# Patient Record
Sex: Male | Born: 1975 | Race: White | Hispanic: No | Marital: Married | Smoking: Never smoker
Health system: Southern US, Community
[De-identification: ages and names within clinical notes are randomized; demographics above are authoritative.]

## PROBLEM LIST (undated history)

## (undated) DIAGNOSIS — F32A Depression, unspecified: Secondary | ICD-10-CM

## (undated) DIAGNOSIS — F419 Anxiety disorder, unspecified: Secondary | ICD-10-CM

## (undated) HISTORY — DX: Depression, unspecified: F32.A

## (undated) HISTORY — DX: Anxiety disorder, unspecified: F41.9

## (undated) HISTORY — PX: TONSILLECTOMY: SUR1361

---

## 2016-02-27 DIAGNOSIS — F411 Generalized anxiety disorder: Secondary | ICD-10-CM | POA: Insufficient documentation

## 2016-02-27 DIAGNOSIS — F3340 Major depressive disorder, recurrent, in remission, unspecified: Secondary | ICD-10-CM | POA: Insufficient documentation

## 2020-11-27 ENCOUNTER — Encounter (HOSPITAL_BASED_OUTPATIENT_CLINIC_OR_DEPARTMENT_OTHER): Payer: Self-pay | Admitting: Family Medicine

## 2020-11-27 ENCOUNTER — Other Ambulatory Visit: Payer: Self-pay

## 2020-11-27 ENCOUNTER — Other Ambulatory Visit (HOSPITAL_BASED_OUTPATIENT_CLINIC_OR_DEPARTMENT_OTHER)
Admission: RE | Admit: 2020-11-27 | Discharge: 2020-11-27 | Disposition: A | Discharge status: Home / Self Care | Payer: 59 | Source: Ambulatory Visit | Attending: Family Medicine | Admitting: Family Medicine

## 2020-11-27 ENCOUNTER — Ambulatory Visit (INDEPENDENT_AMBULATORY_CARE_PROVIDER_SITE_OTHER): Payer: 59 | Admitting: Family Medicine

## 2020-11-27 VITALS — BP 112/88 | HR 95 | Ht 65.0 in | Wt 217.6 lb

## 2020-11-27 DIAGNOSIS — R400 Somnolence: Secondary | ICD-10-CM | POA: Diagnosis not present

## 2020-11-27 DIAGNOSIS — Z6836 Body mass index (BMI) 36.0-36.9, adult: Secondary | ICD-10-CM | POA: Diagnosis not present

## 2020-11-27 DIAGNOSIS — G4700 Insomnia, unspecified: Secondary | ICD-10-CM

## 2020-11-27 DIAGNOSIS — F3181 Bipolar II disorder: Secondary | ICD-10-CM

## 2020-11-27 DIAGNOSIS — F419 Anxiety disorder, unspecified: Secondary | ICD-10-CM | POA: Insufficient documentation

## 2020-11-27 DIAGNOSIS — Z7689 Persons encountering health services in other specified circumstances: Secondary | ICD-10-CM | POA: Diagnosis not present

## 2020-11-27 LAB — HEMOGLOBIN A1C
Hgb A1c MFr Bld: 5.6 % (ref 4.8–5.6)
Mean Plasma Glucose: 114.02 mg/dL

## 2020-11-27 LAB — COMPREHENSIVE METABOLIC PANEL
ALT: 97 U/L — ABNORMAL HIGH (ref 0–44)
AST: 54 U/L — ABNORMAL HIGH (ref 15–41)
Albumin: 4.9 g/dL (ref 3.5–5.0)
Alkaline Phosphatase: 66 U/L (ref 38–126)
Anion gap: 11 (ref 5–15)
BUN: 8 mg/dL (ref 6–20)
CO2: 27 mmol/L (ref 22–32)
Calcium: 10.7 mg/dL — ABNORMAL HIGH (ref 8.9–10.3)
Chloride: 99 mmol/L (ref 98–111)
Creatinine, Ser: 0.96 mg/dL (ref 0.61–1.24)
GFR, Estimated: >60 mL/min (ref >60)
Glucose, Bld: 80 mg/dL (ref 70–99)
Potassium: 4.2 mmol/L (ref 3.5–5.1)
Sodium: 137 mmol/L (ref 135–145)
Total Bilirubin: 0.4 mg/dL (ref 0.3–1.2)
Total Protein: 7.9 g/dL (ref 6.5–8.1)

## 2020-11-27 LAB — CBC WITH DIFFERENTIAL/PLATELET
Abs Immature Granulocytes: 0 10*3/uL (ref 0.00–0.07)
Basophils Absolute: 0 10*3/uL (ref 0.0–0.1)
Basophils Relative: 0 %
Eosinophils Absolute: 0.2 10*3/uL (ref 0.0–0.5)
Eosinophils Relative: 2 %
HCT: 51.7 % (ref 39.0–52.0)
Hemoglobin: 17.7 g/dL — ABNORMAL HIGH (ref 13.0–17.0)
Lymphocytes Relative: 37 %
Lymphs Abs: 4.3 10*3/uL — ABNORMAL HIGH (ref 0.7–4.0)
MCH: 30.4 pg (ref 26.0–34.0)
MCHC: 34.2 g/dL (ref 30.0–36.0)
MCV: 88.7 fL (ref 80.0–100.0)
Monocytes Absolute: 0.6 10*3/uL (ref 0.1–1.0)
Monocytes Relative: 5 %
Neutro Abs: 6.4 10*3/uL (ref 1.7–7.7)
Neutrophils Relative %: 56 %
Platelets: 420 10*3/uL — ABNORMAL HIGH (ref 150–400)
RBC: 5.83 MIL/uL — ABNORMAL HIGH (ref 4.22–5.81)
RDW: 13.6 % (ref 11.5–15.5)
WBC: 11.5 10*3/uL — ABNORMAL HIGH (ref 4.0–10.5)
nRBC: 0 % (ref 0.0–0.2)

## 2020-11-27 LAB — LIPID PANEL
Cholesterol: 392 mg/dL — ABNORMAL HIGH (ref 0–200)
HDL: 50 mg/dL (ref >40)
LDL Cholesterol: UNDETERMINED mg/dL (ref 0–99)
Total CHOL/HDL Ratio: 7.8 RATIO
Triglycerides: 502 mg/dL — ABNORMAL HIGH (ref <150)
VLDL: UNDETERMINED mg/dL (ref 0–40)

## 2020-11-27 LAB — LDL CHOLESTEROL, DIRECT: Direct LDL: 272.4 mg/dL — ABNORMAL HIGH (ref 0–99)

## 2020-11-27 NOTE — Patient Instructions (Signed)
  Medication Instructions:  Your physician recommends that you continue on your current medications as directed. Please refer to the Current Medication list given to you today. --If you need a refill on any your medications before your next appointment, please call your pharmacy first. If no refills are authorized on file call the office.--  Lab Work: Your physician has recommended that you have lab work today: CBC, CMP, Lipid Profile, and A1C If you have labs (blood work) drawn today and your tests are completely normal, you will receive your results only by: Marland Kitchen MyChart Message (if you have MyChart) OR . A phone call from our staff. Please ensure you check your voicemail in the event that you authorized detailed messages to be left on a delegated number. If you have any lab test that is abnormal or we need to change your treatment, we will call you to review the results.  Referrals/Procedures/Imaging: A referral has been placed for you to Dr. Bosie Clos with [facility]. Dr. Bosie Clos is a psychologist who specializes in cognitive and behavioral therapy, he does not prescribe medications. Someone from the scheduling department will be in contact with you in regards to coordinating your consultation. If you do not hear from any of the schedulers within 7-10 business days please give our office a call  A referral has been placed for you to Dietician and Nutrition Therapy for consultation. Someone from the scheduling department will be in contact with you in regards to coordinating your consultation. If you do not hear from any of the schedulers within 7-10 business days please give our office a call.   Follow-Up: Your next appointment:   Your physician recommends that you schedule a follow-up appointment in: 4 WEEKS with Dr. de Peru  Thanks for letting us be apart of your health journey!!  Primary Care and Sports Medicine   Dr. de Peru and [name], DNP, AGNP  We recommend  signing up for the patient portal called "MyChart".  Sign up information is provided on this After Visit Summary.  MyChart is used to connect with patients for Virtual Visits (Telemedicine).  Patients are able to view lab/test results, encounter notes, upcoming appointments, etc.  Non-urgent messages can be sent to your provider as well.   To learn more about what you can do with MyChart, please visit --  [email or web address].

## 2020-11-28 DIAGNOSIS — R400 Somnolence: Secondary | ICD-10-CM

## 2020-11-28 DIAGNOSIS — F3181 Bipolar II disorder: Secondary | ICD-10-CM | POA: Insufficient documentation

## 2020-11-28 DIAGNOSIS — G4700 Insomnia, unspecified: Secondary | ICD-10-CM | POA: Insufficient documentation

## 2020-11-28 HISTORY — DX: Somnolence: R40.0

## 2020-11-28 NOTE — Assessment & Plan Note (Signed)
Epworth Sleepiness Scale completed today with total score of 10 indicating an above average amount of daytime sleepiness May need to consider sleep study for evaluation of possible underlying sleep apnea Discuss further at next office visit

## 2020-11-28 NOTE — Assessment & Plan Note (Signed)
Continue management with psychiatry Continue with aripiprazole, citalopram, trazodone Recommend discussing with them regarding sleep issues and whether any medication changes are needed to address from a medical standpoint

## 2020-11-28 NOTE — Progress Notes (Signed)
New Patient Office Visit  Subjective:  Patient ID: Derek Erickson, male    DOB: 1975-09-01  Age: 45 y.o. MRN: [id number]  CC:  Chief Complaint  Patient presents with  . Establish Care  . Palpitations    Patient states that at night he can feel his heart pounding. He is concerned because his father has a hx of heart disease. Patient states he can feel his heart racing between 80-90 bpm  . Weight Gain    Patient is concerned with weight gain, he states he thinks it may be linked to his antidepressants. He states he eats small amounts and still gains weight    HPI Derek Erickson is a 45 year old male presenting to establish in clinic.  Some concerns today related to recent weight gain as well as heart palpitations.  Past medical history is significant for bipolar 2 disorder, insomnia.  Weight gain: Reports in 2012 when he finished his work in [employer] he weighed around 168 pounds.  He has gradually gained weight since that time.  Most notably he has noted increased weight gain over the last couple of years and feels it may be related to his current medications.  Present medications include aripiprazole, escitalopram, trazodone which are prescribed through psychiatry for management of bipolar disorder, sleep issues.  Has questions about and is interested in modalities to work towards weight loss.  Heart palpitations: Reports he is feels he is mostly at night, less so during the day.  Feels as though his "heart is hammering".  At one point he was using an apple watch and notices heart rate at that time would be about 80 to 100 bpm.  Patient has some increased concerns surrounding this given that his father had a history of heart disease with early heart attacks with his first being younger than the age of 32 -does indicate that his father was a heavy smoker.  Associated with his difficulty sleeping, he does not also have daytime somnolence.  Feels that he does not have much issue in regards to  sleep maintenance however.  Denies any chest pain, shortness of breath, lightheadedness or dizziness related to the palpitations or during the days.  Follows with psychiatry at the [facility] in regards to his bipolar disorder and trying to manage any sleep issues.  Sees him about every 3 months.  Currently prescribed aripiprazole, citalopram and trazodone.  Has had some psychology sessions through the [place], but seems these were more so directed at PTSD treatment.  Past Medical History:  Diagnosis Date  . Anxiety   . Depression     History reviewed. No pertinent surgical history.  Family History  Problem Relation Age of Onset  . Diabetes Mother   . Heart disease Father   . Dementia Maternal Grandmother   . Cancer Maternal Grandfather     Social History   Socioeconomic History  . Marital status: Married    Spouse name: Not on file  . Number of children: Not on file  . Years of education: Not on file  . Highest education level: Not on file  Occupational History  . Not on file  Tobacco Use  . Smoking status: Never Smoker  . Smokeless tobacco: Current User    Types: Chew  Vaping Use  . Vaping Use: Never used  Substance and Sexual Activity  . Alcohol use: Not Currently  . Drug use: Never  . Sexual activity: Yes    Birth control/protection: None  Other Topics Concern  .  Not on file  Social History Narrative  . Not on file   Social Determinants of Health   Financial Resource Strain: Not on file  Food Insecurity: Not on file  Transportation Needs: Not on file  Physical Activity: Not on file  Stress: Not on file  Social Connections: Not on file  Intimate Partner Violence: Not on file    Objective:   Today's Vitals: BP 112/88   Pulse 95   Ht 5\' 5"  (1.651 m)   Wt 217 lb 9.6 oz (98.7 kg)   SpO2 98%   BMI 36.21 kg/m   Physical Exam  Pleasant 45 year old male in no acute distress Cardiovascular exam with regular rate and rhythm, no murmurs appreciated Lungs clear to  auscultation bilaterally  Assessment & Plan:   Problem List Items Addressed This Visit      Other   Bipolar II disorder (HCC)    Continue management with psychiatry Continue with aripiprazole, citalopram, trazodone Recommend discussing with them regarding sleep issues and whether any medication changes are needed to address from a medical standpoint      Insomnia    Will have patient discuss further with psychiatry Will also refer to psychology for further evaluation, likely CBT to help address symptoms      Relevant Orders   Ambulatory referral to Psychology   Daytime somnolence    Epworth Sleepiness Scale completed today with total score of 10 indicating an above average amount of daytime sleepiness May need to consider sleep study for evaluation of possible underlying sleep apnea Discuss further at next office visit       Other Visit Diagnoses    Establishing care with new doctor, encounter for    -  Primary   Relevant Orders   CBC with Differential/Platelet (Completed)   Comprehensive metabolic panel (Completed)   Hemoglobin A1c (Completed)   Lipid panel (Completed)   Amb ref to Medical Nutrition Therapy-[facility]   Anxiety       Relevant Medications   citalopram (CELEXA) 40 MG tablet   traZODone (DESYREL) 100 MG tablet   Other Relevant Orders   CBC with Differential/Platelet (Completed)   Comprehensive metabolic panel (Completed)   Lipid panel (Completed)   BMI 36.0-36.9,adult       Relevant Orders   Hemoglobin A1c (Completed)   Amb ref to Medical Nutrition Therapy-[facility]      Outpatient Encounter Medications as of 11/27/2020  Medication Sig  . ARIPiprazole (ABILIFY) 15 MG tablet Take 1 tablet by mouth daily.  . citalopram (CELEXA) 40 MG tablet Take 1 tablet by mouth daily.  . traZODone (DESYREL) 100 MG tablet Take 100 mg by mouth at bedtime.   No facility-administered encounter medications on file as of 11/27/2020.   Spent 45 minutes on this patient encounter,  including preparation, chart review, face-to-face counseling with patient and coordination of care, and documentation of encounter  Follow-up: Return in about 4 weeks (around 12/25/2020) for Follow Up.   Dontaye Hur J De 12/27/2020, MD

## 2020-11-28 NOTE — Assessment & Plan Note (Signed)
Will have patient discuss further with psychiatry Will also refer to psychology for further evaluation, likely CBT to help address symptoms

## 2020-11-29 ENCOUNTER — Other Ambulatory Visit (HOSPITAL_BASED_OUTPATIENT_CLINIC_OR_DEPARTMENT_OTHER): Payer: Self-pay | Admitting: Family Medicine

## 2020-11-30 ENCOUNTER — Other Ambulatory Visit: Payer: Self-pay

## 2020-11-30 ENCOUNTER — Other Ambulatory Visit (HOSPITAL_BASED_OUTPATIENT_CLINIC_OR_DEPARTMENT_OTHER): Payer: Self-pay | Admitting: *Deleted

## 2020-11-30 ENCOUNTER — Other Ambulatory Visit (HOSPITAL_BASED_OUTPATIENT_CLINIC_OR_DEPARTMENT_OTHER): Payer: Self-pay

## 2020-11-30 ENCOUNTER — Other Ambulatory Visit (HOSPITAL_BASED_OUTPATIENT_CLINIC_OR_DEPARTMENT_OTHER)
Admission: RE | Admit: 2020-11-30 | Discharge: 2020-11-30 | Disposition: A | Discharge status: Home / Self Care | Payer: 59 | Source: Ambulatory Visit | Attending: Family Medicine | Admitting: Family Medicine

## 2020-11-30 DIAGNOSIS — R7401 Elevation of levels of liver transaminase levels: Secondary | ICD-10-CM

## 2020-11-30 LAB — IRON AND TIBC
Iron: 70 ug/dL (ref 45–182)
Saturation Ratios: 17 % — ABNORMAL LOW (ref 17.9–39.5)
TIBC: 414 ug/dL (ref 250–450)
UIBC: 344 ug/dL

## 2020-11-30 LAB — HEPATITIS B CORE ANTIBODY, IGM: Hep B C IgM: NONREACTIVE

## 2020-11-30 LAB — HEPATITIS B SURFACE ANTIGEN: Hepatitis B Surface Ag: NONREACTIVE

## 2020-11-30 LAB — HEPATITIS B SURFACE ANTIBODY,QUALITATIVE: Hep B S Ab: REACTIVE — AB

## 2020-11-30 LAB — HEPATITIS C ANTIBODY: HCV Ab: NONREACTIVE

## 2020-12-01 ENCOUNTER — Other Ambulatory Visit (HOSPITAL_BASED_OUTPATIENT_CLINIC_OR_DEPARTMENT_OTHER)
Admission: RE | Admit: 2020-12-01 | Discharge: 2020-12-01 | Disposition: A | Discharge status: Home / Self Care | Payer: 59 | Source: Ambulatory Visit | Attending: Family Medicine | Admitting: Family Medicine

## 2020-12-01 ENCOUNTER — Telehealth (HOSPITAL_BASED_OUTPATIENT_CLINIC_OR_DEPARTMENT_OTHER): Payer: Self-pay

## 2020-12-01 DIAGNOSIS — R7401 Elevation of levels of liver transaminase levels: Secondary | ICD-10-CM | POA: Insufficient documentation

## 2020-12-01 NOTE — Telephone Encounter (Signed)
Received a message from the lab that there was not enough sample to run the erythropoietin lab that Dr. Tommi Rumps Peru requested Will reach out to the patient to see if he can go by the lab and give more sample

## 2020-12-02 LAB — ERYTHROPOIETIN: Erythropoietin: 8 m[IU]/mL (ref 2.6–18.5)

## 2020-12-04 ENCOUNTER — Other Ambulatory Visit: Payer: Self-pay

## 2020-12-04 ENCOUNTER — Ambulatory Visit (HOSPITAL_BASED_OUTPATIENT_CLINIC_OR_DEPARTMENT_OTHER)
Admission: RE | Admit: 2020-12-04 | Discharge: 2020-12-04 | Disposition: A | Discharge status: Home / Self Care | Payer: 59 | Source: Ambulatory Visit | Attending: Family Medicine | Admitting: Family Medicine

## 2020-12-04 DIAGNOSIS — K8020 Calculus of gallbladder without cholecystitis without obstruction: Secondary | ICD-10-CM | POA: Diagnosis not present

## 2020-12-04 DIAGNOSIS — R7401 Elevation of levels of liver transaminase levels: Secondary | ICD-10-CM | POA: Insufficient documentation

## 2020-12-04 DIAGNOSIS — K760 Fatty (change of) liver, not elsewhere classified: Secondary | ICD-10-CM | POA: Diagnosis not present

## 2020-12-06 ENCOUNTER — Other Ambulatory Visit: Payer: Self-pay

## 2020-12-06 ENCOUNTER — Other Ambulatory Visit (HOSPITAL_BASED_OUTPATIENT_CLINIC_OR_DEPARTMENT_OTHER): Payer: Self-pay

## 2020-12-06 ENCOUNTER — Ambulatory Visit: Payer: 59 | Attending: Internal Medicine

## 2020-12-06 DIAGNOSIS — Z23 Encounter for immunization: Secondary | ICD-10-CM

## 2020-12-06 MED ORDER — PFIZER-BIONT COVID-19 VAC-TRIS 30 MCG/0.3ML IM SUSP
INTRAMUSCULAR | 0 refills | Status: DC
  Filled 2020-12-06: qty 0.3, 1d supply, fill #0

## 2020-12-06 NOTE — Progress Notes (Signed)
   Covid-19 Vaccination Clinic  Name:  Derek Erickson    MRN: [id number] DOB: [birth year]  12/06/2020  Mr. Derek Erickson was observed post Covid-19 immunization for 15 minutes without incident. He was provided with Vaccine Information Sheet and instruction to access the V-Safe system.   Mr. Derek Erickson was instructed to call 911 with any severe reactions post vaccine: Marland Kitchen Difficulty breathing  . Swelling of face and throat  . A fast heartbeat  . A bad rash all over body  . Dizziness and weakness   Immunizations Administered    Name Date Dose VIS Date Route   Moderna Covid-19 Booster Vaccine 12/06/2020  1:41 PM 0.25 mL 05/17/2020 Intramuscular   Manufacturer: Moderna   Lot: [id number]   NDC: 05697-948-01

## 2020-12-19 ENCOUNTER — Ambulatory Visit: Payer: 59 | Admitting: Registered"

## 2021-01-01 ENCOUNTER — Ambulatory Visit (HOSPITAL_BASED_OUTPATIENT_CLINIC_OR_DEPARTMENT_OTHER): Payer: 59 | Admitting: Family Medicine

## 2021-01-04 ENCOUNTER — Other Ambulatory Visit: Payer: Self-pay

## 2021-01-04 ENCOUNTER — Encounter (HOSPITAL_BASED_OUTPATIENT_CLINIC_OR_DEPARTMENT_OTHER): Payer: Self-pay | Admitting: Family Medicine

## 2021-01-04 ENCOUNTER — Ambulatory Visit (HOSPITAL_BASED_OUTPATIENT_CLINIC_OR_DEPARTMENT_OTHER): Payer: 59 | Admitting: Family Medicine

## 2021-01-04 DIAGNOSIS — D72820 Lymphocytosis (symptomatic): Secondary | ICD-10-CM | POA: Diagnosis not present

## 2021-01-04 DIAGNOSIS — D751 Secondary polycythemia: Secondary | ICD-10-CM

## 2021-01-04 DIAGNOSIS — E781 Pure hyperglyceridemia: Secondary | ICD-10-CM | POA: Diagnosis not present

## 2021-01-04 DIAGNOSIS — D72829 Elevated white blood cell count, unspecified: Secondary | ICD-10-CM | POA: Insufficient documentation

## 2021-01-04 DIAGNOSIS — R7401 Elevation of levels of liver transaminase levels: Secondary | ICD-10-CM | POA: Diagnosis not present

## 2021-01-04 DIAGNOSIS — E785 Hyperlipidemia, unspecified: Secondary | ICD-10-CM

## 2021-01-04 HISTORY — DX: Secondary polycythemia: D75.1

## 2021-01-04 HISTORY — DX: Elevated white blood cell count, unspecified: D72.829

## 2021-01-04 NOTE — Assessment & Plan Note (Addendum)
Noted on most recent labs, discussed options with patient including initiation of statin therapy, lifestyle modifications Patient wishes to hold off on statin initiation and proceed with lifestyle modifications with monitoring lipid panel in about 6 to 12 months Patient also has upcoming appointment with nutritionist, encouraged to continue with this and discuss lifestyle modifications further during that visit Recheck lipid panel in about 6 to 12 months

## 2021-01-04 NOTE — Assessment & Plan Note (Signed)
As with erythrocytosis above, will refer to hematology for further evaluation and management

## 2021-01-04 NOTE — Assessment & Plan Note (Signed)
Mild elevation on recent lab work Erythropoietin level checked and was within normal range which would be considered inappropriate given level of red blood cell count Will refer to hematology for further evaluation and management

## 2021-01-04 NOTE — Assessment & Plan Note (Signed)
Noted on recent labs, discussed treatment options as with dyslipidemia including statin therapy, lifestyle modifications Meeting with nutritionist Plan to recheck lipid panel in 6 to 12 months

## 2021-01-04 NOTE — Assessment & Plan Note (Signed)
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

## 2021-01-04 NOTE — Progress Notes (Signed)
**Note Erickson-Identified via Obfuscation**     Procedures performed today:    None.  Independent interpretation of notes and tests performed by another provider:   None.  Brief History, Exam, Impression, and Recommendations:    Derek Erickson is a 46 year old male presenting for follow-up.  He reports that he has been doing well since last visit.  No specific questions or concerns today.  BP 126/84   Pulse 85   Ht 5\' 5"  (1.651 m)   Wt 214 lb (97.1 kg)   SpO2 97%   BMI 35.61 kg/m   Dyslipidemia Noted on most recent labs, discussed options with patient including initiation of statin therapy, lifestyle modifications Patient wishes to hold off on statin initiation and proceed with lifestyle modifications with monitoring lipid panel in about 6 to 12 months Patient also has upcoming appointment with nutritionist, encouraged to continue with this and discuss lifestyle modifications further during that visit Recheck lipid panel in about 6 to 12 months  Erythrocytosis Mild elevation on recent lab work Erythropoietin level checked and was within normal range which would be considered inappropriate given level of red blood cell count Will refer to hematology for further evaluation and management  Leukocytosis As with erythrocytosis above, will refer to hematology for further evaluation and management  Hypertriglyceridemia Noted on recent labs, discussed treatment options as with dyslipidemia including statin therapy, lifestyle modifications Meeting with nutritionist Plan to recheck lipid panel in 6 to 12 months  Transaminitis Likely 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  Advised that ideally follow-up will occur after initial evaluation with hematology.   ___________________________________________ Derek Erickson , MD, ABFM, CAQSM Primary Care and Sports Medicine [facility]

## 2021-01-04 NOTE — Patient Instructions (Signed)
  Medication Instructions:  Your physician recommends that you continue on your current medications as directed. Please refer to the Current Medication list given to you today. --If you need a refill on any your medications before your next appointment, please call your pharmacy first. If no refills are authorized on file call the office.--  Referrals/Procedures/Imaging: A referral has been placed for you to Hematology for evaluation and treatment. Someone from the scheduling department will be in contact with you in regards to coordinating your consultation. If you do not hear from any of the schedulers within 7-10 business days please give our office a call.  Follow-Up: Your next appointment:   Your physician recommends that you schedule a follow-up appointment in: 3 MONTHS with Dr. de Peru  Thanks for letting us be apart of your health journey!!  Primary Care and Sports Medicine   Dr. de Peru and [name], DNP, AGNP  We recommend signing up for the patient portal called "MyChart".  Sign up information is provided on this After Visit Summary.  MyChart is used to connect with patients for Virtual Visits (Telemedicine).  Patients are able to view lab/test results, encounter notes, upcoming appointments, etc.  Non-urgent messages can be sent to your provider as well.   To learn more about what you can do with MyChart, please visit --  [email or web address].

## 2021-01-09 ENCOUNTER — Telehealth: Payer: Self-pay | Admitting: Oncology

## 2021-01-09 NOTE — Telephone Encounter (Signed)
Scheduled appt per 6/9 referral. Pt aware.  

## 2021-02-07 ENCOUNTER — Encounter: Payer: Self-pay | Admitting: Dietician

## 2021-02-07 ENCOUNTER — Inpatient Hospital Stay: Payer: 59 | Admitting: Oncology

## 2021-02-07 ENCOUNTER — Other Ambulatory Visit: Payer: Self-pay

## 2021-02-07 ENCOUNTER — Encounter: Payer: 59 | Attending: Family Medicine | Admitting: Dietician

## 2021-02-07 VITALS — Ht 66.0 in | Wt 213.9 lb

## 2021-02-07 DIAGNOSIS — E669 Obesity, unspecified: Secondary | ICD-10-CM | POA: Diagnosis not present

## 2021-02-07 NOTE — Progress Notes (Signed)
Medical Nutrition Therapy  Appointment Start time:  [phone]  Appointment End time:  1550  Primary concerns today: Fatty Liver  Referral diagnosis: E66.9 - Obesity, unspecified Preferred learning style: No preference indicated Learning readiness: Ready   NUTRITION ASSESSMENT   Anthropometrics  Ht: 5'6" Wt: 213.9 lbs Body mass index is 34.52 kg/m.   Clinical Medical Hx: Bipolar II Disorder, Dyslipidemia, Fatty Liver Disease Medications: Abilify, Celexa, Trazadone Labs: TC - 392 (Very High) LDL - 272.4 (Very High) TGL - 502 (Very High) AST - 54 (High) ALT - 97 (High) Notable Signs/Symptoms: Chronic fatigue  Lifestyle & Dietary Hx Pt wife Arline Asp is present during appointment. Pt reports having elevated white and red blood cells, and hemoglobin. Pt will be seeing an oncologist for blood tests. Pt reports dipping and vaping, is concerned about the health effects it has on them. Pt had an abdominal scan that revealed fatty liver disease, lab values reflect this diagnosis. Pt reports drinking ~40 oz of water daily. Pt states they drink way too much soda, 3-4 16 oz Mountain Dews.  Pt also states they love the taste of milk and will drink large amounts at once. 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 Has a fridge and microwave at work. Pt walks most of the day at work, gets very little physical activity otherwise. Pt reports usually only eating a small snack for lunch usually, and usually skips breakfast. Pt eats the majority of their calories for dinner and in the evening.  Estimated daily fluid intake: 100 oz Supplements: None Sleep: Not well Stress / self-care: 2/10 Current average weekly physical activity: ADLs  24-Hr Dietary Recall First Meal: 2 Hot dogs (no bun) Snack: none Second Meal: Handful of Cheez-Its Snack: none Third Meal: McDonald's cheeseburger, fries, large coke Snack: Small stack of Pringles Beverages: 3 bottles of water,   NUTRITION DIAGNOSIS   NI-5.8.3 Inappropriate intake of types of carbohydrates (specify): Sugar sweetened beverages As related to fatty liver.  As evidenced by Abdominal CT scan, severely elevated triglycerides, and pt admission of excessive consumption of soda and milk..   NUTRITION INTERVENTION  Nutrition education (E-1) on the following topics:  Educate pt on the difference between LDL and HDL cholesterol. Educate pt on the factors that can increase and decrease HDL cholesterol. Including exercise to increase HDL, and tobacco use to decrease HDL. Educate pt on factors that can elevate LDL cholesterol, including high dietary intake of saturated fats. Educate pt on identifying sources of saturated fats, and how to make alternative food choices to lower saturated fat intake. Educate pt on the role of soluble fiber in binding to cholesterol in the GI tract an eliminating it from the body. Educate pt on dietary sources of soluble fiber. Educate pt on the potential dietary causes of hypertriglyceridemia.Educate on the role of elevated LDL,total cholesterol, and triglycerides on cardiovascular health. Educate pt on the role of physical activity in lowering LDL and increasing HDL cholesterol. Educated patient on the development and progression of fatty liver. Educated patient on nutrition strategies that could improve fatty liver including reduction of SSBs, reduction of saturated fats, increasing unsaturated fats, 150 minutes a week of moderate intensity physical activity, and weight loss of 10% body weight.  Handouts Provided Include  Balanced Plate Saturated Fat Worksheet Fatty Liver Disease Guidelines.  Learning Style & Readiness for Change Teaching method utilized: Visual & Auditory  Demonstrated degree of understanding via: Teach Back  Barriers to learning/adherence to lifestyle change: None  Goals  Established by Pt Work towards eating three meals a day, about 5-6 hours apart! Begin to build your meals using the  proportions of the Balanced Plate. First, select your carb choice(s) for the meal,  Next, select your source of protein to pair with your carb choice(s). Finally, complete the remaining half of your meal with a variety of non-starchy vegetables. Work your moderate intensity physical activity up to 150 minutes each week. Try to keep your heart rate in your target heart rate range of 115 - 150 BPM Work on reducing your soda intake. Try to start by only having 3 sodas each day. Then move to 2 sodas a day in the next week or two until you Try incorporating "Zero" sodas in place of regular sodas during this process as well.  Consider taking a fish oil supplement. Make sure it is "Burpless" or "Burp Free"   MONITORING & EVALUATION Dietary intake, weekly physical activity, and SSB intake in 2 months.  Next Steps  Patient is to follow up with RD.

## 2021-02-07 NOTE — Patient Instructions (Addendum)
Work towards eating three meals a day, about 5-6 hours apart!  Begin to build your meals using the proportions of the Balanced Plate. First, select your carb choice(s) for the meal,  Next, select your source of protein to pair with your carb choice(s). Finally, complete the remaining half of your meal with a variety of non-starchy vegetables.  Work your moderate intensity physical activity up to 150 minutes each week. Try to keep your heart rate in your target heart rate range of 115 - 150 BPM  Work on reducing your soda intake. Try to start by only having 3 sodas each day. Then move to 2 sodas a day in the next week or two until you Try incorporating "Zero" sodas in place of regular sodas during this process as well.   Consider taking a fish oil supplement. Make sure it is "Burpless" or "Burp Free"

## 2021-02-26 ENCOUNTER — Other Ambulatory Visit: Payer: Self-pay | Admitting: *Deleted

## 2021-02-26 DIAGNOSIS — D751 Secondary polycythemia: Secondary | ICD-10-CM

## 2021-02-27 ENCOUNTER — Inpatient Hospital Stay: Payer: 59 | Attending: Oncology

## 2021-02-27 ENCOUNTER — Other Ambulatory Visit: Payer: Self-pay

## 2021-02-27 ENCOUNTER — Inpatient Hospital Stay: Payer: 59 | Admitting: Oncology

## 2021-02-27 DIAGNOSIS — D751 Secondary polycythemia: Secondary | ICD-10-CM | POA: Diagnosis not present

## 2021-02-27 LAB — SAVE SMEAR(SSMR), FOR PROVIDER SLIDE REVIEW

## 2021-02-27 LAB — CBC WITH DIFFERENTIAL (CANCER CENTER ONLY)
Abs Immature Granulocytes: 0.02 10*3/uL (ref 0.00–0.07)
Basophils Absolute: 0.1 10*3/uL (ref 0.0–0.1)
Basophils Relative: 1 %
Eosinophils Absolute: 0.2 10*3/uL (ref 0.0–0.5)
Eosinophils Relative: 2 %
HCT: 50.2 % (ref 39.0–52.0)
Hemoglobin: 16.7 g/dL (ref 13.0–17.0)
Immature Granulocytes: 0 %
Lymphocytes Relative: 34 %
Lymphs Abs: 3 10*3/uL (ref 0.7–4.0)
MCH: 30.5 pg (ref 26.0–34.0)
MCHC: 33.3 g/dL (ref 30.0–36.0)
MCV: 91.8 fL (ref 80.0–100.0)
Monocytes Absolute: 0.7 10*3/uL (ref 0.1–1.0)
Monocytes Relative: 8 %
Neutro Abs: 4.8 10*3/uL (ref 1.7–7.7)
Neutrophils Relative %: 55 %
Platelet Count: 350 10*3/uL (ref 150–400)
RBC: 5.47 MIL/uL (ref 4.22–5.81)
RDW: 13.7 % (ref 11.5–15.5)
WBC Count: 8.8 10*3/uL (ref 4.0–10.5)
nRBC: 0 % (ref 0.0–0.2)

## 2021-03-14 ENCOUNTER — Encounter (HOSPITAL_BASED_OUTPATIENT_CLINIC_OR_DEPARTMENT_OTHER): Payer: Self-pay | Admitting: Family Medicine

## 2021-04-04 ENCOUNTER — Emergency Department (HOSPITAL_BASED_OUTPATIENT_CLINIC_OR_DEPARTMENT_OTHER): Payer: 59 | Admitting: Radiology

## 2021-04-04 ENCOUNTER — Encounter (HOSPITAL_BASED_OUTPATIENT_CLINIC_OR_DEPARTMENT_OTHER): Payer: Self-pay

## 2021-04-04 ENCOUNTER — Emergency Department (HOSPITAL_BASED_OUTPATIENT_CLINIC_OR_DEPARTMENT_OTHER)
Admission: EM | Admit: 2021-04-04 | Discharge: 2021-04-04 | Disposition: A | Discharge status: Home / Self Care | Payer: 59 | Attending: Emergency Medicine | Admitting: Emergency Medicine

## 2021-04-04 ENCOUNTER — Other Ambulatory Visit: Payer: Self-pay

## 2021-04-04 DIAGNOSIS — R079 Chest pain, unspecified: Secondary | ICD-10-CM

## 2021-04-04 DIAGNOSIS — R072 Precordial pain: Secondary | ICD-10-CM | POA: Diagnosis not present

## 2021-04-04 DIAGNOSIS — R0789 Other chest pain: Secondary | ICD-10-CM | POA: Diagnosis not present

## 2021-04-04 DIAGNOSIS — Z79899 Other long term (current) drug therapy: Secondary | ICD-10-CM | POA: Insufficient documentation

## 2021-04-04 LAB — BASIC METABOLIC PANEL
Anion gap: 11 (ref 5–15)
BUN: 6 mg/dL (ref 6–20)
CO2: 25 mmol/L (ref 22–32)
Calcium: 9.3 mg/dL (ref 8.9–10.3)
Chloride: 103 mmol/L (ref 98–111)
Creatinine, Ser: 0.8 mg/dL (ref 0.61–1.24)
GFR, Estimated: >60 mL/min (ref >60)
Glucose, Bld: 92 mg/dL (ref 70–99)
Potassium: 3.6 mmol/L (ref 3.5–5.1)
Sodium: 139 mmol/L (ref 135–145)

## 2021-04-04 LAB — CBC
HCT: 45 % (ref 39.0–52.0)
Hemoglobin: 15.4 g/dL (ref 13.0–17.0)
MCH: 30.2 pg (ref 26.0–34.0)
MCHC: 34.2 g/dL (ref 30.0–36.0)
MCV: 88.2 fL (ref 80.0–100.0)
Platelets: 346 10*3/uL (ref 150–400)
RBC: 5.1 MIL/uL (ref 4.22–5.81)
RDW: 13.2 % (ref 11.5–15.5)
WBC: 9.8 10*3/uL (ref 4.0–10.5)
nRBC: 0 % (ref 0.0–0.2)

## 2021-04-04 LAB — TROPONIN I (HIGH SENSITIVITY)
Troponin I (High Sensitivity): 3 ng/L (ref <18)
Troponin I (High Sensitivity): 3 ng/L (ref <18)

## 2021-04-04 MED ORDER — KETOROLAC TROMETHAMINE 30 MG/ML IJ SOLN
30.0000 mg | Freq: Once | INTRAMUSCULAR | Status: AC
  Administered 2021-04-04: 30 mg via INTRAVENOUS
  Filled 2021-04-04: qty 1

## 2021-04-04 NOTE — ED Provider Notes (Signed)
[facility] EMERGENCY DEPT Provider Note   CSN: [id number] Arrival date & time: 04/04/21  0036     History Chief Complaint  Patient presents with   Chest Pain    Derek Erickson is a 45 y.o. male.  Patient is a 45 year old male with history of anxiety.  Patient presenting today for evaluation of chest discomfort.  This started at approximately 730 this evening while doing homework with his daughter.  He describes tightness to the center of his chest with no shortness of breath, nausea, or diaphoresis.  Patient denies any recent exertional symptoms.  Cardiac risk factors include vaping and family history with his father apparently having had an MI in his mid 42s.  The history is provided by the patient.  Chest Pain Pain location:  Substernal area Pain quality: tightness   Pain radiates to:  Does not radiate Pain severity:  Moderate Onset quality:  Sudden Duration:  6 hours Timing:  Constant Chronicity:  New Relieved by:  Nothing Worsened by:  Nothing     Past Medical History:  Diagnosis Date   Anxiety    Depression     Patient Active Problem List   Diagnosis Date Noted   Dyslipidemia 01/04/2021   Hypertriglyceridemia 01/04/2021   Transaminitis 01/04/2021   Erythrocytosis 01/04/2021   Leukocytosis 01/04/2021   Bipolar II disorder (HCC) 11/28/2020   Insomnia 11/28/2020   Daytime somnolence 11/28/2020    History reviewed. No pertinent surgical history.     Family History  Problem Relation Age of Onset   Diabetes Mother    Heart disease Father    Dementia Maternal Grandmother    Cancer Maternal Grandfather     Social History   Tobacco Use   Smoking status: Never   Smokeless tobacco: Current    Types: Chew  Vaping Use   Vaping Use: Never used  Substance Use Topics   Alcohol use: Not Currently   Drug use: Never    Home Medications Prior to Admission medications   Medication Sig Start Date End Date Taking? Authorizing Provider   ARIPiprazole (ABILIFY) 15 MG tablet Take 1 tablet by mouth daily. 11/23/20   [provider]  citalopram (CELEXA) 40 MG tablet Take 1 tablet by mouth daily. 11/16/20 11/17/21  [provider]  traZODone (DESYREL) 100 MG tablet Take 100 mg by mouth at bedtime. 11/23/20   [provider]    Allergies    Prazosin  Review of Systems   Review of Systems  Cardiovascular:  Positive for chest pain.  All other systems reviewed and are negative.  Physical Exam Updated Vital Signs BP 132/77   Pulse 78   Temp 98.7 F (37.1 C) (Oral)   Resp 17   Ht 5\' 6"  (1.676 m)   Wt 98 kg   SpO2 97%   BMI 34.86 kg/m   Physical Exam Vitals and nursing note reviewed.  Constitutional:      General: He is not in acute distress.    Appearance: He is well-developed. He is not diaphoretic.  HENT:     Head: Normocephalic and atraumatic.  Cardiovascular:     Rate and Rhythm: Normal rate and regular rhythm.     Heart sounds: No murmur heard.   No friction rub.  Pulmonary:     Effort: Pulmonary effort is normal. No respiratory distress.     Breath sounds: Normal breath sounds. No wheezing or rales.  Abdominal:     General: Bowel sounds are normal. There is no distension.  Palpations: Abdomen is soft.     Tenderness: There is no abdominal tenderness.  Musculoskeletal:        General: Normal range of motion.     Cervical back: Normal range of motion and neck supple.     Right lower leg: No tenderness. No edema.     Left lower leg: No tenderness. No edema.  Skin:    General: Skin is warm and dry.  Neurological:     Mental Status: He is alert and oriented to person, place, and time.     Coordination: Coordination normal.    ED Results / Procedures / Treatments   Labs (all labs ordered are listed, but only abnormal results are displayed) Labs Reviewed  BASIC METABOLIC PANEL  CBC  TROPONIN I (HIGH SENSITIVITY)    EKG EKG Interpretation  Date/Time:  Wednesday  April 04 2021 00:50:19 EDT Ventricular Rate:  71 PR Interval:  128 QRS Duration: 94 QT Interval:  386 QTC Calculation: 419 R Axis:   76 Text Interpretation: Normal sinus rhythm Normal ECG Confirmed by Geoffery Lyons ([id number]) on 04/04/2021 12:53:42 AM  Radiology DG Chest 2 View  Result Date: 04/04/2021 CLINICAL DATA:  Chest pain EXAM: CHEST - 2 VIEW COMPARISON:  None. FINDINGS: The heart size and mediastinal contours are within normal limits. Both lungs are clear. The visualized skeletal structures are unremarkable. IMPRESSION: No active cardiopulmonary disease. Electronically Signed   By: Charlett Nose M.D.   On: 04/04/2021 01:12    Procedures Procedures   Medications Ordered in ED Medications  ketorolac (TORADOL) 30 MG/ML injection 30 mg (has no administration in time range)    ED Course  I have reviewed the triage vital signs and the nursing notes.  Pertinent labs & imaging results that were available during my care of the patient were reviewed by me and considered in my medical decision making (see chart for details).    MDM Rules/Calculators/A&P  Patient is a 45 year old male with no significant past medical history presenting with complaints of chest discomfort, the details of which are described in the HPI.  Initial EKG unremarkable and troponin x2 is negative.  Patient feeling better after receiving Toradol.  Patient's symptoms seem noncardiac in nature.  He does describe putting a new spice on Ramen noodles which preceded his symptoms.  Patient sitting up appropriate for discharge.  He does have a family history of heart disease so will place a referral to cardiology.  Patient to return as needed.  Final Clinical Impression(s) / ED Diagnoses Final diagnoses:  None    Rx / DC Orders ED Discharge Orders     None        Geoffery Lyons, MD 04/04/21 [phone]

## 2021-04-04 NOTE — ED Triage Notes (Signed)
Patient here POV from Home with CP.  Patient states his Pain is Mid-Chest and radiates slightly to the Right and Left Chest. Pain began approximately 1930 and has lessened in intensity but is still present.   Ambulatory. NAD Noted. GCS 15. A&Ox4. Mild to Moderate SOB with CP. No Nausea. No Vomiting.

## 2021-04-04 NOTE — Discharge Instructions (Addendum)
Follow-up with cardiology in the next few days.  Their contact information has been provided in this discharge summary for you to call and make these arrangements.  Return to the emergency department in the meantime if you develop worsening pain, difficulty breathing, or other new and concerning symptoms.

## 2021-04-09 ENCOUNTER — Ambulatory Visit (HOSPITAL_BASED_OUTPATIENT_CLINIC_OR_DEPARTMENT_OTHER): Payer: 59 | Admitting: Family Medicine

## 2021-04-10 ENCOUNTER — Ambulatory Visit: Payer: 59 | Admitting: Dietician

## 2021-04-12 ENCOUNTER — Ambulatory Visit (HOSPITAL_BASED_OUTPATIENT_CLINIC_OR_DEPARTMENT_OTHER): Payer: 59 | Admitting: Family Medicine

## 2021-04-17 ENCOUNTER — Other Ambulatory Visit (HOSPITAL_BASED_OUTPATIENT_CLINIC_OR_DEPARTMENT_OTHER): Payer: Self-pay

## 2021-04-17 MED ORDER — INFLUENZA VAC SPLIT QUAD 0.5 ML IM SUSY
PREFILLED_SYRINGE | INTRAMUSCULAR | 0 refills | Status: DC
  Filled 2021-04-17: qty 0.5, 1d supply, fill #0

## 2021-04-18 ENCOUNTER — Other Ambulatory Visit (HOSPITAL_BASED_OUTPATIENT_CLINIC_OR_DEPARTMENT_OTHER): Payer: Self-pay

## 2021-04-26 ENCOUNTER — Ambulatory Visit (HOSPITAL_BASED_OUTPATIENT_CLINIC_OR_DEPARTMENT_OTHER): Payer: 59 | Admitting: Family Medicine

## 2021-05-11 ENCOUNTER — Other Ambulatory Visit (HOSPITAL_BASED_OUTPATIENT_CLINIC_OR_DEPARTMENT_OTHER): Payer: Self-pay | Admitting: Nurse Practitioner

## 2021-05-11 ENCOUNTER — Other Ambulatory Visit (HOSPITAL_BASED_OUTPATIENT_CLINIC_OR_DEPARTMENT_OTHER): Payer: Self-pay

## 2021-05-11 DIAGNOSIS — J069 Acute upper respiratory infection, unspecified: Secondary | ICD-10-CM

## 2021-05-11 MED ORDER — HYDROCODONE BIT-HOMATROP MBR 5-1.5 MG/5ML PO SOLN
5.0000 mL | Freq: Three times a day (TID) | ORAL | 0 refills | Status: DC | PRN
  Filled 2021-05-11: qty 120, 8d supply, fill #0

## 2021-05-11 MED ORDER — AMOXICILLIN-POT CLAVULANATE 875-125 MG PO TABS
1.0000 | ORAL_TABLET | Freq: Two times a day (BID) | ORAL | 0 refills | Status: DC
  Filled 2021-05-11: qty 10, 5d supply, fill #0

## 2021-06-06 ENCOUNTER — Other Ambulatory Visit (HOSPITAL_BASED_OUTPATIENT_CLINIC_OR_DEPARTMENT_OTHER): Payer: Self-pay

## 2021-06-06 ENCOUNTER — Encounter (HOSPITAL_BASED_OUTPATIENT_CLINIC_OR_DEPARTMENT_OTHER): Payer: Self-pay | Admitting: Nurse Practitioner

## 2021-06-06 ENCOUNTER — Other Ambulatory Visit: Payer: Self-pay

## 2021-06-06 ENCOUNTER — Ambulatory Visit (HOSPITAL_BASED_OUTPATIENT_CLINIC_OR_DEPARTMENT_OTHER): Payer: 59 | Admitting: Nurse Practitioner

## 2021-06-06 VITALS — BP 120/76 | HR 83 | Wt 216.4 lb

## 2021-06-06 DIAGNOSIS — R638 Other symptoms and signs concerning food and fluid intake: Secondary | ICD-10-CM

## 2021-06-06 DIAGNOSIS — Z6835 Body mass index (BMI) 35.0-35.9, adult: Secondary | ICD-10-CM | POA: Insufficient documentation

## 2021-06-06 MED ORDER — PHENTERMINE HCL 37.5 MG PO CAPS
ORAL_CAPSULE | ORAL | 0 refills | Status: DC
  Filled 2021-06-06: qty 30, fill #0

## 2021-06-06 MED ORDER — PHENTERMINE HCL 37.5 MG PO CAPS
ORAL_CAPSULE | ORAL | 0 refills | Status: DC
  Filled 2021-06-06: qty 30, 30d supply, fill #0

## 2021-06-06 MED ORDER — PHENTERMINE HCL 37.5 MG PO CAPS
ORAL_CAPSULE | ORAL | 0 refills | Status: DC
  Filled 2021-06-06: qty 30, fill #0
  Filled 2021-07-12: qty 30, 30d supply, fill #0

## 2021-06-06 NOTE — Patient Instructions (Signed)
We will start phentermine today. Let me know if you have any concerns or questions about it or if you notice any worsening anxiety symptoms.

## 2021-06-06 NOTE — Assessment & Plan Note (Signed)
Weight gain in the setting of decreased physical activity and medications for BPII D/O.  Discussed medication options for patient and risks associated.  He is aware that phentermine may cause increased anxiety symptoms and will stop this medication if he notices any symptoms that are causing a change in mental status.  We also discussed GLP-1 medication options, which would be great for his lipids and CV health.  At this time a joint decision made to trial phentermine with diet and increased activity to help jump start weight loss. If he is unable to tolerate this medication or reaches the end of the medication use and still needs additional weight loss assistance, we will consider GLP-1.  F/U in 3 months for weight monitoring.

## 2021-06-06 NOTE — Progress Notes (Signed)
Established Patient Office Visit  Subjective:  Patient ID: Derek Erickson, male    DOB: October 07, 1975  Age: 45 y.o. MRN: [id number]  CC: No chief complaint on file.   HPI Derek Erickson presents for fatigue and weight concerns. He reports that he has recently gained weight and is having a very difficult time getting the extra weight off. He does have a history of high cholesterol and elevated liver enzymes, which likely indicate fatty liver. A1c was normal.  He has not tried anything in the past with the exception of OTC products which did not work.  He has a friend using phentermine with excellent results and wanted to know if this option would be good for him.    ROS Review of Systems All review of systems negative except what is listed in the HPI    Objective:    Physical Exam Vitals and nursing note reviewed.  Constitutional:      General: He is not in acute distress.    Appearance: Normal appearance. He is obese.  HENT:     Head: Normocephalic.  Eyes:     Extraocular Movements: Extraocular movements intact.     Conjunctiva/sclera: Conjunctivae normal.     Pupils: Pupils are equal, round, and reactive to light.  Neck:     Vascular: No carotid bruit.  Cardiovascular:     Rate and Rhythm: Normal rate and regular rhythm.     Pulses: Normal pulses.     Heart sounds: Normal heart sounds. No murmur heard. Pulmonary:     Effort: Pulmonary effort is normal. No respiratory distress.     Breath sounds: Normal breath sounds.  Abdominal:     General: Bowel sounds are normal.     Palpations: Abdomen is soft.  Musculoskeletal:        General: Normal range of motion.     Cervical back: Normal range of motion. No tenderness.     Right lower leg: No edema.     Left lower leg: No edema.  Lymphadenopathy:     Cervical: No cervical adenopathy.  Skin:    General: Skin is warm and dry.     Capillary Refill: Capillary refill takes less than 2 seconds.  Neurological:     General: No  focal deficit present.     Mental Status: He is alert and oriented to person, place, and time.  Psychiatric:        Mood and Affect: Mood normal.        Behavior: Behavior normal.        Thought Content: Thought content normal.        Judgment: Judgment normal.    There were no vitals taken for this visit. Wt Readings from Last 3 Encounters:  04/04/21 216 lb (98 kg)  02/07/21 213 lb 14.4 oz (97 kg)  01/04/21 214 lb (97.1 kg)     Health Maintenance Due  Topic Date Due   COVID-19 Vaccine (1) 10/12/1977   Pneumococcal Vaccine 67-38 Years old (1 - PCV) Never done   HIV Screening  Never done    There are no preventive care reminders to display for this patient.  No results found for: TSH Lab Results  Component Value Date   WBC 9.8 04/04/2021   HGB 15.4 04/04/2021   HCT 45.0 04/04/2021   MCV 88.2 04/04/2021   PLT 346 04/04/2021   Lab Results  Component Value Date   NA 139 04/04/2021   K 3.6 04/04/2021   CO2 25  04/04/2021   GLUCOSE 92 04/04/2021   BUN 6 04/04/2021   CREATININE 0.80 04/04/2021   BILITOT 0.4 11/27/2020   ALKPHOS 66 11/27/2020   AST 54 (H) 11/27/2020   ALT 97 (H) 11/27/2020   PROT 7.9 11/27/2020   ALBUMIN 4.9 11/27/2020   CALCIUM 9.3 04/04/2021   ANIONGAP 11 04/04/2021   Lab Results  Component Value Date   CHOL 392 (H) 11/27/2020   Lab Results  Component Value Date   HDL 50 11/27/2020   Lab Results  Component Value Date   LDLCALC UNABLE TO CALCULATE IF TRIGLYCERIDE OVER 400 mg/dL 08/67/6195   Lab Results  Component Value Date   TRIG 502 (H) 11/27/2020   Lab Results  Component Value Date   CHOLHDL 7.8 11/27/2020   Lab Results  Component Value Date   HGBA1C 5.6 11/27/2020      Assessment & Plan:   Problem List Items Addressed This Visit     Increased body mass index (BMI) - Primary    Weight gain in the setting of decreased physical activity and medications for BPII D/O.  Discussed medication options for patient and risks  associated.  He is aware that phentermine may cause increased anxiety symptoms and will stop this medication if he notices any symptoms that are causing a change in mental status.  We also discussed GLP-1 medication options, which would be great for his lipids and CV health.  At this time a joint decision made to trial phentermine with diet and increased activity to help jump start weight loss. If he is unable to tolerate this medication or reaches the end of the medication use and still needs additional weight loss assistance, we will consider GLP-1.  F/U in 3 months for weight monitoring.       Relevant Medications   phentermine 37.5 MG capsule (Start on 08/01/2021)   phentermine 37.5 MG capsule (Start on 07/04/2021)   phentermine 37.5 MG capsule    Meds ordered this encounter  Medications   phentermine 37.5 MG capsule    Sig: One capsule by mouth qAM    Dispense:  30 capsule    Refill:  0   phentermine 37.5 MG capsule    Sig: One capsule by mouth qAM    Dispense:  30 capsule    Refill:  0   phentermine 37.5 MG capsule    Sig: One capsule by mouth qAM    Dispense:  30 capsule    Refill:  0    Follow-up: Return in about 3 months (around 09/06/2021) for weight.    Tollie Eth, NP

## 2021-07-10 ENCOUNTER — Other Ambulatory Visit (HOSPITAL_BASED_OUTPATIENT_CLINIC_OR_DEPARTMENT_OTHER): Payer: Self-pay

## 2021-07-10 ENCOUNTER — Ambulatory Visit (INDEPENDENT_AMBULATORY_CARE_PROVIDER_SITE_OTHER): Payer: 59 | Admitting: Nurse Practitioner

## 2021-07-10 ENCOUNTER — Encounter (HOSPITAL_BASED_OUTPATIENT_CLINIC_OR_DEPARTMENT_OTHER): Payer: Self-pay | Admitting: Nurse Practitioner

## 2021-07-10 ENCOUNTER — Ambulatory Visit (HOSPITAL_BASED_OUTPATIENT_CLINIC_OR_DEPARTMENT_OTHER): Payer: 59 | Admitting: Family Medicine

## 2021-07-10 DIAGNOSIS — R21 Rash and other nonspecific skin eruption: Secondary | ICD-10-CM

## 2021-07-10 DIAGNOSIS — F4310 Post-traumatic stress disorder, unspecified: Secondary | ICD-10-CM | POA: Insufficient documentation

## 2021-07-10 DIAGNOSIS — L309 Dermatitis, unspecified: Secondary | ICD-10-CM | POA: Insufficient documentation

## 2021-07-10 DIAGNOSIS — F4312 Post-traumatic stress disorder, chronic: Secondary | ICD-10-CM | POA: Insufficient documentation

## 2021-07-10 HISTORY — DX: Rash and other nonspecific skin eruption: R21

## 2021-07-10 MED ORDER — CLOTRIMAZOLE-BETAMETHASONE 1-0.05 % EX CREA
1.0000 "application " | TOPICAL_CREAM | Freq: Two times a day (BID) | CUTANEOUS | 0 refills | Status: DC
  Filled 2021-07-10: qty 30, 15d supply, fill #0

## 2021-07-10 NOTE — Progress Notes (Signed)
Acute Office Visit  Subjective:    Patient ID: Derek Erickson, male    DOB: [birth year], 45 y.o.   MRN: [id number]  No chief complaint on file.   HPI Patient is in today for rash on his left ring finger where his wedding band sits. He started wearing a new rubber style ring recently and this weekend noticed redness on the dorsal surface of the ring finger with a blister. He thought it may have been an infected hair and squeezed the area, but only clear liquid drained from it.  He reports it feels like it is stinging, but not necessarily itching. It has not gotten any better or worse.  He has stopped wearing the ring on that finger for now.   Past Medical History:  Diagnosis Date   Anxiety    Depression     No past surgical history on file.  Family History  Problem Relation Age of Onset   Diabetes Mother    Heart disease Father    Dementia Maternal Grandmother    Cancer Maternal Grandfather     Social History   Socioeconomic History   Marital status: Married    Spouse name: Not on file   Number of children: Not on file   Years of education: Not on file   Highest education level: Not on file  Occupational History   Not on file  Tobacco Use   Smoking status: Never   Smokeless tobacco: Current    Types: Chew  Vaping Use   Vaping Use: Never used  Substance and Sexual Activity   Alcohol use: Not Currently   Drug use: Never   Sexual activity: Yes    Birth control/protection: None  Other Topics Concern   Not on file  Social History Narrative   Not on file   Social Determinants of Health   Financial Resource Strain: Not on file  Food Insecurity: Not on file  Transportation Needs: Not on file  Physical Activity: Not on file  Stress: Not on file  Social Connections: Not on file  Intimate Partner Violence: Not on file    Outpatient Medications Prior to Visit  Medication Sig Dispense Refill   amoxicillin-clavulanate (AUGMENTIN) 875-125 MG tablet Take 1 tablet by  mouth 2 (two) times daily. 10 tablet 0   ARIPiprazole (ABILIFY) 15 MG tablet Take 1 tablet by mouth daily.     citalopram (CELEXA) 40 MG tablet Take 1 tablet by mouth daily.     HYDROcodone bit-homatropine (HYCODAN) 5-1.5 MG/5ML syrup Take 5 mLs by mouth every 8 (eight) hours as needed for cough. 120 mL 0   influenza vac split quadrivalent PF (FLUARIX) 0.5 ML injection Inject into the muscle. 0.5 mL 0   [START ON 08/01/2021] phentermine 37.5 MG capsule One capsule by mouth qAM 30 capsule 0   phentermine 37.5 MG capsule One capsule by mouth qAM 30 capsule 0   phentermine 37.5 MG capsule One capsule by mouth qAM 30 capsule 0   traZODone (DESYREL) 100 MG tablet Take 100 mg by mouth at bedtime.     No facility-administered medications prior to visit.    Allergies  Allergen Reactions   Prazosin Other (See Comments)    LOC and Dizziness     Review of Systems All review of systems negative except what is listed in the HPI     Objective:    Physical Exam Vitals and nursing note reviewed.  Constitutional:      Appearance: Normal appearance.  Skin:  General: Skin is warm and dry.     Findings: Erythema and rash present.       Neurological:     Mental Status: He is alert.    There were no vitals taken for this visit. Wt Readings from Last 3 Encounters:  06/06/21 216 lb 6.4 oz (98.2 kg)  04/04/21 216 lb (98 kg)  02/07/21 213 lb 14.4 oz (97 kg)    Health Maintenance Due  Topic Date Due   COVID-19 Vaccine (1) 10/12/1977   Pneumococcal Vaccine 13-12 Years old (1 - PCV) Never done   HIV Screening  Never done    There are no preventive care reminders to display for this patient.   No results found for: TSH Lab Results  Component Value Date   WBC 9.8 04/04/2021   HGB 15.4 04/04/2021   HCT 45.0 04/04/2021   MCV 88.2 04/04/2021   PLT 346 04/04/2021   Lab Results  Component Value Date   NA 139 04/04/2021   K 3.6 04/04/2021   CO2 25 04/04/2021   GLUCOSE 92 04/04/2021    BUN 6 04/04/2021   CREATININE 0.80 04/04/2021   BILITOT 0.4 11/27/2020   ALKPHOS 66 11/27/2020   AST 54 (H) 11/27/2020   ALT 97 (H) 11/27/2020   PROT 7.9 11/27/2020   ALBUMIN 4.9 11/27/2020   CALCIUM 9.3 04/04/2021   ANIONGAP 11 04/04/2021   Lab Results  Component Value Date   CHOL 392 (H) 11/27/2020   Lab Results  Component Value Date   HDL 50 11/27/2020   Lab Results  Component Value Date   LDLCALC UNABLE TO CALCULATE IF TRIGLYCERIDE OVER 400 mg/dL 37/85/8850   Lab Results  Component Value Date   TRIG 502 (H) 11/27/2020   Lab Results  Component Value Date   CHOLHDL 7.8 11/27/2020   Lab Results  Component Value Date   HGBA1C 5.6 11/27/2020       Assessment & Plan:   Problem List Items Addressed This Visit   None    No orders of the defined types were placed in this encounter.    Tollie Eth, NP

## 2021-07-10 NOTE — Patient Instructions (Signed)
The rash appears to be caused by a fungal infection most likely related to the moisture that sits on the skin under the ring when you wash your hands. Avoid wearing the ring until the rash has completely resolved. Once you do start wearing it again, be sure to dry under the ring when you get your hands wet to prevent the infection from recurring.   If it does not get better, please let me know.

## 2021-07-10 NOTE — Assessment & Plan Note (Signed)
Suspect tinea infection to the finger related to increased moisture trapped with silicone ring.  No signs of drainage, streaking, or edema present.  Highly unlikely allergic response to silicone.  Recommend avoiding wearing the ring until the rash has completely resolved.  Will send lotrisone cream for BID use until resolved. It this takes longer than 10 days, he will let us know.  Recommend removing ring when hands are wet and drying area thoroughly before replacing the ring to avoid trapping water.

## 2021-07-11 ENCOUNTER — Other Ambulatory Visit (HOSPITAL_BASED_OUTPATIENT_CLINIC_OR_DEPARTMENT_OTHER): Payer: Self-pay

## 2021-07-12 ENCOUNTER — Other Ambulatory Visit (HOSPITAL_BASED_OUTPATIENT_CLINIC_OR_DEPARTMENT_OTHER): Payer: Self-pay

## 2021-07-20 ENCOUNTER — Other Ambulatory Visit (HOSPITAL_BASED_OUTPATIENT_CLINIC_OR_DEPARTMENT_OTHER): Payer: Self-pay

## 2021-08-16 ENCOUNTER — Other Ambulatory Visit (HOSPITAL_BASED_OUTPATIENT_CLINIC_OR_DEPARTMENT_OTHER): Payer: Self-pay

## 2021-08-16 ENCOUNTER — Encounter (HOSPITAL_BASED_OUTPATIENT_CLINIC_OR_DEPARTMENT_OTHER): Payer: Self-pay | Admitting: Nurse Practitioner

## 2021-08-16 ENCOUNTER — Other Ambulatory Visit: Payer: Self-pay

## 2021-08-16 ENCOUNTER — Ambulatory Visit (HOSPITAL_BASED_OUTPATIENT_CLINIC_OR_DEPARTMENT_OTHER): Payer: 59 | Admitting: Nurse Practitioner

## 2021-08-16 DIAGNOSIS — R300 Dysuria: Secondary | ICD-10-CM

## 2021-08-16 LAB — POCT URINALYSIS DIP (CLINITEK)
Bilirubin, UA: NEGATIVE
Glucose, UA: NEGATIVE mg/dL
Ketones, POC UA: NEGATIVE mg/dL
Leukocytes, UA: NEGATIVE
Nitrite, UA: NEGATIVE
POC PROTEIN,UA: NEGATIVE
Spec Grav, UA: 1.02 (ref 1.010–1.025)
Urobilinogen, UA: 0.2 E.U./dL
pH, UA: 7 (ref 5.0–8.0)

## 2021-08-16 MED ORDER — TAMSULOSIN HCL 0.4 MG PO CAPS
0.4000 mg | ORAL_CAPSULE | Freq: Every day | ORAL | 0 refills | Status: DC
  Filled 2021-08-16: qty 21, 21d supply, fill #0

## 2021-08-16 MED ORDER — NITROFURANTOIN MONOHYD MACRO 100 MG PO CAPS
100.0000 mg | ORAL_CAPSULE | Freq: Two times a day (BID) | ORAL | 0 refills | Status: DC
  Filled 2021-08-16: qty 10, 5d supply, fill #0

## 2021-08-16 NOTE — Progress Notes (Signed)
Acute Office Visit  Subjective:    Patient ID: Derek Erickson, male    DOB: 1976/05/19, 46 y.o.   MRN: [id number]  No chief complaint on file.   HPI Patient is in today for dysuria, increased urination, and urgency that has been going on for a few days. He also reports intermittent mild pain in the right lower back. He denies fever, chills, nausea, abdominal pain, difficulty starting stream, weak stream, or hematuria. He is sexually active in a mutually monogamous relationship. He does tell me that he drinks several mountain dew's a day and he previously drank approximately 4 monster drinks a day. He has cut back to one or two a month at this time. He endorses working to drink more water, but admits he does not drink as much as he should.   Past Medical History:  Diagnosis Date   Anxiety    Depression     No past surgical history on file.  Family History  Problem Relation Age of Onset   Diabetes Mother    Heart disease Father    Dementia Maternal Grandmother    Cancer Maternal Grandfather     Social History   Socioeconomic History   Marital status: Married    Spouse name: Not on file   Number of children: Not on file   Years of education: Not on file   Highest education level: Not on file  Occupational History   Not on file  Tobacco Use   Smoking status: Never   Smokeless tobacco: Current    Types: Chew  Vaping Use   Vaping Use: Never used  Substance and Sexual Activity   Alcohol use: Not Currently   Drug use: Never   Sexual activity: Yes    Birth control/protection: None  Other Topics Concern   Not on file  Social History Narrative   Not on file   Social Determinants of Health   Financial Resource Strain: Not on file  Food Insecurity: Not on file  Transportation Needs: Not on file  Physical Activity: Not on file  Stress: Not on file  Social Connections: Not on file  Intimate Partner Violence: Not on file    Outpatient Medications Prior to Visit   Medication Sig Dispense Refill   ARIPiprazole (ABILIFY) 15 MG tablet Take 1 tablet by mouth daily.     citalopram (CELEXA) 40 MG tablet Take 1 tablet by mouth daily.     phentermine 37.5 MG capsule One capsule by mouth qAM 30 capsule 0   phentermine 37.5 MG capsule One capsule by mouth qAM 30 capsule 0   phentermine 37.5 MG capsule One capsule by mouth qAM 30 capsule 0   traZODone (DESYREL) 100 MG tablet Take 100 mg by mouth at bedtime.     amoxicillin-clavulanate (AUGMENTIN) 875-125 MG tablet Take 1 tablet by mouth 2 (two) times daily. 10 tablet 0   clotrimazole-betamethasone (LOTRISONE) cream Apply 1 application topically 2 (two) times daily. 30 g 0   HYDROcodone bit-homatropine (HYCODAN) 5-1.5 MG/5ML syrup Take 5 mLs by mouth every 8 (eight) hours as needed for cough. 120 mL 0   influenza vac split quadrivalent PF (FLUARIX) 0.5 ML injection Inject into the muscle. 0.5 mL 0   No facility-administered medications prior to visit.    Allergies  Allergen Reactions   Prazosin Other (See Comments)    LOC and Dizziness     Review of Systems All review of systems negative except what is listed in the HPI  Objective:    Physical Exam Constitutional:      General: He is not in acute distress.    Appearance: Normal appearance. He is not ill-appearing.  HENT:     Head: Normocephalic.  Eyes:     Extraocular Movements: Extraocular movements intact.     Conjunctiva/sclera: Conjunctivae normal.     Pupils: Pupils are equal, round, and reactive to light.  Cardiovascular:     Rate and Rhythm: Normal rate and regular rhythm.     Pulses: Normal pulses.     Heart sounds: Normal heart sounds.  Pulmonary:     Effort: Pulmonary effort is normal.     Breath sounds: Normal breath sounds.  Abdominal:     General: Bowel sounds are normal. There is no distension.     Palpations: Abdomen is soft. There is no mass.     Tenderness: There is no abdominal tenderness. There is no right CVA  tenderness, left CVA tenderness, guarding or rebound.  Musculoskeletal:     Right lower leg: No edema.     Left lower leg: No edema.  Skin:    General: Skin is warm and dry.     Capillary Refill: Capillary refill takes less than 2 seconds.  Neurological:     General: No focal deficit present.     Mental Status: He is alert and oriented to person, place, and time.  Psychiatric:        Mood and Affect: Mood normal.        Behavior: Behavior normal.        Thought Content: Thought content normal.        Judgment: Judgment normal.    There were no vitals taken for this visit. Wt Readings from Last 3 Encounters:  06/06/21 216 lb 6.4 oz (98.2 kg)  04/04/21 216 lb (98 kg)  02/07/21 213 lb 14.4 oz (97 kg)    Health Maintenance Due  Topic Date Due   COVID-19 Vaccine (1) 10/12/1976    There are no preventive care reminders to display for this patient.   No results found for: TSH Lab Results  Component Value Date   WBC 9.8 04/04/2021   HGB 15.4 04/04/2021   HCT 45.0 04/04/2021   MCV 88.2 04/04/2021   PLT 346 04/04/2021   Lab Results  Component Value Date   NA 139 04/04/2021   K 3.6 04/04/2021   CO2 25 04/04/2021   GLUCOSE 92 04/04/2021   BUN 6 04/04/2021   CREATININE 0.80 04/04/2021   BILITOT 0.4 11/27/2020   ALKPHOS 66 11/27/2020   AST 54 (H) 11/27/2020   ALT 97 (H) 11/27/2020   PROT 7.9 11/27/2020   ALBUMIN 4.9 11/27/2020   CALCIUM 9.3 04/04/2021   ANIONGAP 11 04/04/2021   Lab Results  Component Value Date   CHOL 392 (H) 11/27/2020   Lab Results  Component Value Date   HDL 50 11/27/2020   Lab Results  Component Value Date   LDLCALC UNABLE TO CALCULATE IF TRIGLYCERIDE OVER 400 mg/dL 11/27/2020   Lab Results  Component Value Date   TRIG 502 (H) 11/27/2020   Lab Results  Component Value Date   CHOLHDL 7.8 11/27/2020   Lab Results  Component Value Date   HGBA1C 5.6 11/27/2020       Assessment & Plan:  Dysuria - Plan: nitrofurantoin,  macrocrystal-monohydrate, (MACROBID) 100 MG capsule, tamsulosin (FLOMAX) 0.4 MG CAPS capsule, POCT URINALYSIS DIP (CLINITEK), Urine Culture UA positive for trace-intact blood present today, negative for  nitrates and leukocytes. Given symptoms and history, suspect possible nephrolithiasis present without warning signs for obstruction. At this time will begin treatment with tamsulosin and macrobid for prophylactic treatment while we await culture. No concerns for STI or prostate issues at this time.  Recommend increase water intake and avoid soda and energy drinks as these can worsen condition.  Recommend monitor for new or worsening symptoms and notify immediately if any of these occur: fever, chills, abdominal pain, diarrhea, nausea, sweating, severe low back, side, or low pelvic pain, blood in urine, decreased urine.  He will follow-up if sx worsen or fail to improve.   Meds ordered this encounter  Medications   nitrofurantoin, macrocrystal-monohydrate, (MACROBID) 100 MG capsule    Sig: Take 1 capsule (100 mg total) by mouth 2 (two) times daily.    Dispense:  10 capsule    Refill:  0   tamsulosin (FLOMAX) 0.4 MG CAPS capsule    Sig: Take 1 capsule (0.4 mg total) by mouth daily.    Dispense:  21 capsule    Refill:  0     Orma Render, NP

## 2021-08-18 LAB — URINE CULTURE

## 2021-08-24 ENCOUNTER — Other Ambulatory Visit (HOSPITAL_BASED_OUTPATIENT_CLINIC_OR_DEPARTMENT_OTHER): Payer: Self-pay

## 2021-09-06 ENCOUNTER — Other Ambulatory Visit (HOSPITAL_BASED_OUTPATIENT_CLINIC_OR_DEPARTMENT_OTHER): Payer: Self-pay | Admitting: Nurse Practitioner

## 2021-09-06 ENCOUNTER — Other Ambulatory Visit: Payer: Self-pay

## 2021-09-06 ENCOUNTER — Ambulatory Visit (HOSPITAL_BASED_OUTPATIENT_CLINIC_OR_DEPARTMENT_OTHER)
Admission: RE | Admit: 2021-09-06 | Discharge: 2021-09-06 | Disposition: A | Discharge status: Home / Self Care | Payer: 59 | Source: Ambulatory Visit | Attending: Nurse Practitioner | Admitting: Nurse Practitioner

## 2021-09-06 DIAGNOSIS — R109 Unspecified abdominal pain: Secondary | ICD-10-CM | POA: Diagnosis not present

## 2021-09-06 DIAGNOSIS — R34 Anuria and oliguria: Secondary | ICD-10-CM | POA: Insufficient documentation

## 2021-09-07 ENCOUNTER — Ambulatory Visit: Payer: 59 | Attending: Internal Medicine

## 2021-09-07 ENCOUNTER — Other Ambulatory Visit (HOSPITAL_BASED_OUTPATIENT_CLINIC_OR_DEPARTMENT_OTHER): Payer: Self-pay

## 2021-09-07 ENCOUNTER — Ambulatory Visit (HOSPITAL_BASED_OUTPATIENT_CLINIC_OR_DEPARTMENT_OTHER): Payer: 59 | Admitting: Family Medicine

## 2021-09-07 DIAGNOSIS — Z23 Encounter for immunization: Secondary | ICD-10-CM

## 2021-09-07 MED ORDER — MODERNA COVID-19 BIVAL BOOSTER 50 MCG/0.5ML IM SUSP
INTRAMUSCULAR | 0 refills | Status: DC
  Filled 2021-09-07: qty 0.5, 1d supply, fill #0

## 2021-09-07 NOTE — Progress Notes (Signed)
° °  Covid-19 Vaccination Clinic  Name:  Jiovanny Burdell    MRN: [id number] DOB: [birth year]  09/07/2021  Mr. Yurchak was observed post Covid-19 immunization for 15 minutes without incident. He was provided with Vaccine Information Sheet and instruction to access the V-Safe system.   Mr. Valdes was instructed to call 911 with any severe reactions post vaccine: Difficulty breathing  Swelling of face and throat  A fast heartbeat  A bad rash all over body  Dizziness and weakness   Immunizations Administered     Name Date Dose VIS Date Route   Pfizer Covid-19 Vaccine Bivalent Booster 09/07/2021  2:10 PM 0.3 mL 03/28/2021 Intramuscular   Manufacturer: [employer], [employer]   Lot: [id number]   NDC: [phone]

## 2021-09-11 ENCOUNTER — Other Ambulatory Visit (HOSPITAL_BASED_OUTPATIENT_CLINIC_OR_DEPARTMENT_OTHER): Payer: Self-pay | Admitting: Nurse Practitioner

## 2021-09-11 DIAGNOSIS — R638 Other symptoms and signs concerning food and fluid intake: Secondary | ICD-10-CM | POA: Diagnosis not present

## 2021-09-11 DIAGNOSIS — R300 Dysuria: Secondary | ICD-10-CM

## 2021-09-11 DIAGNOSIS — R34 Anuria and oliguria: Secondary | ICD-10-CM | POA: Diagnosis not present

## 2021-09-12 LAB — TSH: TSH: 1.55 u[IU]/mL (ref 0.450–4.500)

## 2021-09-12 LAB — PSA TOTAL (REFLEX TO FREE): Prostate Specific Ag, Serum: 1 ng/mL (ref 0.0–4.0)

## 2021-09-12 LAB — T3: T3, Total: 133 ng/dL (ref 71–180)

## 2021-09-12 LAB — T4: T4, Total: 8.4 ug/dL (ref 4.5–12.0)

## 2021-09-14 ENCOUNTER — Other Ambulatory Visit: Payer: Self-pay

## 2021-09-14 ENCOUNTER — Ambulatory Visit (HOSPITAL_BASED_OUTPATIENT_CLINIC_OR_DEPARTMENT_OTHER): Payer: 59 | Admitting: Nurse Practitioner

## 2021-09-14 ENCOUNTER — Encounter (HOSPITAL_BASED_OUTPATIENT_CLINIC_OR_DEPARTMENT_OTHER): Payer: Self-pay | Admitting: Nurse Practitioner

## 2021-09-14 ENCOUNTER — Other Ambulatory Visit (HOSPITAL_BASED_OUTPATIENT_CLINIC_OR_DEPARTMENT_OTHER): Payer: Self-pay

## 2021-09-14 VITALS — BP 117/82 | HR 86 | Ht 66.0 in | Wt 220.2 lb

## 2021-09-14 DIAGNOSIS — E785 Hyperlipidemia, unspecified: Secondary | ICD-10-CM

## 2021-09-14 DIAGNOSIS — R3129 Other microscopic hematuria: Secondary | ICD-10-CM | POA: Diagnosis not present

## 2021-09-14 DIAGNOSIS — Z6835 Body mass index (BMI) 35.0-35.9, adult: Secondary | ICD-10-CM | POA: Diagnosis not present

## 2021-09-14 DIAGNOSIS — E781 Pure hyperglyceridemia: Secondary | ICD-10-CM

## 2021-09-14 HISTORY — DX: Other microscopic hematuria: R31.29

## 2021-09-14 MED ORDER — ONDANSETRON HCL 8 MG PO TABS
8.0000 mg | ORAL_TABLET | Freq: Three times a day (TID) | ORAL | 2 refills | Status: DC | PRN
  Filled 2021-09-14 (×2): qty 20, 7d supply, fill #0
  Filled 2021-11-15: qty 20, 7d supply, fill #1
  Filled 2022-01-24: qty 20, 7d supply, fill #2

## 2021-09-14 MED ORDER — SEMAGLUTIDE-WEIGHT MANAGEMENT 0.5 MG/0.5ML ~~LOC~~ SOAJ
0.5000 mg | SUBCUTANEOUS | 0 refills | Status: AC
  Filled 2021-09-14 – 2021-10-26 (×5): qty 2, 28d supply, fill #0

## 2021-09-14 MED ORDER — SEMAGLUTIDE-WEIGHT MANAGEMENT 1 MG/0.5ML ~~LOC~~ SOAJ
1.0000 mg | SUBCUTANEOUS | 3 refills | Status: AC
  Filled 2021-09-14: qty 2, fill #0
  Filled 2021-11-15: qty 2, 28d supply, fill #0
  Filled 2021-12-10: qty 2, 28d supply, fill #1

## 2021-09-14 MED ORDER — SEMAGLUTIDE-WEIGHT MANAGEMENT 0.25 MG/0.5ML ~~LOC~~ SOAJ: 0.2500 mg | SUBCUTANEOUS | 0 refills | Status: AC

## 2021-09-14 NOTE — Assessment & Plan Note (Signed)
No significant weight change with phentermine.  At this time, recommend trial of alternative medication to see if we can get his weight down as he does have additional risk factors that would benefit from improved weight control.  Joint decision to trial [facility]. Instructions and warnings provided today.  Patient provided with sample in office with starter pack. We will monitor for medication SE and weight every 3 months with labs at least every 6 months to monitor progression and response.  Patient encouraged to contact the office with any questions or concerns.

## 2021-09-14 NOTE — Progress Notes (Signed)
Established Patient Office Visit  Subjective:  Patient ID: Derek Erickson, male    DOB: November 11, 1975  Age: 46 y.o. MRN: [id number]  CC:  Chief Complaint  Patient presents with   Follow-up    Patient presents today for follow up for weight loss. He is also concerned about being unable to hold his urine. He did give a urine sample. No refills needed today.    HPI Derek Erickson presents for follow-up on weight management and to discuss concerns with bladder pressure and urinary urgency with incontinence.   Weight Derek Erickson reports he has been taking the phentermine as scheduled, but has not noticed much change in his weight. He tells me has had fluctuation with about 5 lbs, but nothing significant. He is not having any side effects of the medication- no increased anxiety, palpitations, CP, dizziness, insomnia. 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 He does watch his diet.   Bladder He has an ongoing issue with pelvic discomfort and pressure along with increased urinary urgency and some incontinence in the past 2-3 months. We have seen him for this issue in the past and have tested PSA and for UTI. PSA levels normal and no infectious source identified in UA. Small lysed blood was present in the past and he was treated with Tamsulosin for possible prostate enlargement and/or nephrolithiasis. He has not passed any kidney stones. KUB was negative for visible stones, but did show stool burden present. He reports that he has been taking the Tamsulosin regularly and for the first timet his morning he was able to hold his urine- he is hopeful that the symptoms are improving. He tells me this happened once before when he was about 46 years old- this resolved on it's own after a few weeks and he was not seen or treated. He is married and denies any new or additional sexual partners. He denies dysuria, abdominal pain, leg pain, low back pain.    Outpatient Medications Prior to Visit   Medication Sig Dispense Refill   ARIPiprazole (ABILIFY) 15 MG tablet Take 1 tablet by mouth daily.     citalopram (CELEXA) 40 MG tablet Take 1 tablet by mouth daily.     COVID-19 mRNA bivalent vaccine, Moderna, (MODERNA COVID-19 BIVAL BOOSTER) 50 MCG/0.5ML injection Inject into the muscle. 0.5 mL 0   phentermine 37.5 MG capsule One capsule by mouth qAM 30 capsule 0   phentermine 37.5 MG capsule One capsule by mouth qAM 30 capsule 0   phentermine 37.5 MG capsule One capsule by mouth qAM 30 capsule 0   tamsulosin (FLOMAX) 0.4 MG CAPS capsule Take 1 capsule (0.4 mg total) by mouth daily. 21 capsule 0   traZODone (DESYREL) 100 MG tablet Take 100 mg by mouth at bedtime.     nitrofurantoin, macrocrystal-monohydrate, (MACROBID) 100 MG capsule Take 1 capsule (100 mg total) by mouth 2 (two) times daily. 10 capsule 0   No facility-administered medications prior to visit.    Allergies  Allergen Reactions   Prazosin Other (See Comments)    LOC and Dizziness     ROS Review of Systems All review of systems negative except what is listed in the HPI    Objective:    Physical Exam Vitals and nursing note reviewed.  Constitutional:      Appearance: Normal appearance. He is obese.  HENT:     Head: Normocephalic.  Eyes:     Extraocular Movements: Extraocular movements intact.  Conjunctiva/sclera: Conjunctivae normal.     Pupils: Pupils are equal, round, and reactive to light.  Cardiovascular:     Rate and Rhythm: Normal rate and regular rhythm.     Pulses: Normal pulses.     Heart sounds: Normal heart sounds.  Pulmonary:     Effort: Pulmonary effort is normal.     Breath sounds: Normal breath sounds.  Abdominal:     General: Bowel sounds are normal. There is no distension.     Palpations: Abdomen is soft.     Tenderness: There is no abdominal tenderness. There is no right CVA tenderness, left CVA tenderness, guarding or rebound.  Musculoskeletal:        General: Normal range of  motion.     Cervical back: Normal range of motion.     Right lower leg: No edema.     Left lower leg: No edema.  Skin:    General: Skin is warm and dry.     Capillary Refill: Capillary refill takes less than 2 seconds.  Neurological:     General: No focal deficit present.     Mental Status: He is alert and oriented to person, place, and time.  Psychiatric:        Mood and Affect: Mood normal.        Behavior: Behavior normal.        Thought Content: Thought content normal.        Judgment: Judgment normal.    BP 117/82    Pulse 86    Ht 5\' 6"  (1.676 m)    Wt 220 lb 3.2 oz (99.9 kg)    SpO2 97%    BMI 35.54 kg/m  Wt Readings from Last 3 Encounters:  09/14/21 220 lb 3.2 oz (99.9 kg)  06/06/21 216 lb 6.4 oz (98.2 kg)  04/04/21 216 lb (98 kg)     Assessment & Plan:   Problem List Items Addressed This Visit     BMI 35.0-35.9,adult    No significant weight change with phentermine.  At this time, recommend trial of alternative medication to see if we can get his weight down as he does have additional risk factors that would benefit from improved weight control.  Joint decision to trial [facility]. Instructions and warnings provided today.  Patient provided with sample in office with starter pack. We will monitor for medication SE and weight every 3 months with labs at least every 6 months to monitor progression and response.  Patient encouraged to contact the office with any questions or concerns.        Relevant Medications   Semaglutide-Weight Management 0.25 MG/0.5ML SOAJ   Semaglutide-Weight Management 0.5 MG/0.5ML SOAJ (Start on 10/13/2021)   Semaglutide-Weight Management 1 MG/0.5ML SOAJ (Start on 11/11/2021)   ondansetron (ZOFRAN) 8 MG tablet   Other microscopic hematuria - Primary    Low pelvic pressure with no pain and increased urination with incontinence at times. UA today and a few weeks ago does show the presence of small trace amounts of lysed blood with no other unusual  findings.  PSA normal. CBC normal. Abdominal exam unremarkable.  Tamsulosin trial does seem to be helping, but just noticed this change today.  I am concerned about the presence of blood and the pressure sensation.  Recommendation for bowel cleanout to see if stool burden noted on KUB could be contributing to symptoms. Discussed with patient the recommendation for evaluation by urology given that the symptoms have been present for several weeks now.  He would like to see how the Tamsulosin does over the next week since he has noticed a difference this morning.  Joint decision to continue Tamsulosin trial for 1 week and monitor. Will recheck urine in 1 week and consider urology referral if symptoms fail to improve.       Dyslipidemia   Relevant Medications   Semaglutide-Weight Management 0.25 MG/0.5ML SOAJ   Semaglutide-Weight Management 0.5 MG/0.5ML SOAJ (Start on 10/13/2021)   Semaglutide-Weight Management 1 MG/0.5ML SOAJ (Start on 11/11/2021)   ondansetron (ZOFRAN) 8 MG tablet   Hypertriglyceridemia   Relevant Medications   Semaglutide-Weight Management 0.25 MG/0.5ML SOAJ   Semaglutide-Weight Management 0.5 MG/0.5ML SOAJ (Start on 10/13/2021)   Semaglutide-Weight Management 1 MG/0.5ML SOAJ (Start on 11/11/2021)   ondansetron (ZOFRAN) 8 MG tablet    Meds ordered this encounter  Medications   Semaglutide-Weight Management 0.25 MG/0.5ML SOAJ    Sig: Inject 0.25 mg into the skin once a week for 28 days.    Dispense:  2 mL    Refill:  0   Semaglutide-Weight Management 0.5 MG/0.5ML SOAJ    Sig: Inject 0.5 mg into the skin once a week for 28 days.    Dispense:  2 mL    Refill:  0   Semaglutide-Weight Management 1 MG/0.5ML SOAJ    Sig: Inject 1 mg into the skin once a week for 28 days.    Dispense:  2 mL    Refill:  3   ondansetron (ZOFRAN) 8 MG tablet    Sig: Take 1 tablet (8 mg total) by mouth every 8 (eight) hours as needed for nausea or vomiting.    Dispense:  20 tablet    Refill:  2     Follow-up: Return in about 3 months (around 12/12/2021) for weight.    Orma Render, NP

## 2021-09-14 NOTE — Patient Instructions (Addendum)
I am going to reach out to urology to see if they have any suggestions.  Keep taking the flomax daily. Let me know if there are any changes at all. I will let you know once I have talked with Urology about a plan.    We will plan to start the [facility]. The coupon can be downloaded at  [email or web address]  I will also send in Zofran in case you get some nausea.

## 2021-09-14 NOTE — Assessment & Plan Note (Signed)
Low pelvic pressure with no pain and increased urination with incontinence at times. UA today and a few weeks ago does show the presence of small trace amounts of lysed blood with no other unusual findings.  PSA normal. CBC normal. Abdominal exam unremarkable.  Tamsulosin trial does seem to be helping, but just noticed this change today.  I am concerned about the presence of blood and the pressure sensation.  Recommendation for bowel cleanout to see if stool burden noted on KUB could be contributing to symptoms. Discussed with patient the recommendation for evaluation by urology given that the symptoms have been present for several weeks now. He would like to see how the Tamsulosin does over the next week since he has noticed a difference this morning.  Joint decision to continue Tamsulosin trial for 1 week and monitor. Will recheck urine in 1 week and consider urology referral if symptoms fail to improve.

## 2021-09-17 ENCOUNTER — Other Ambulatory Visit (HOSPITAL_BASED_OUTPATIENT_CLINIC_OR_DEPARTMENT_OTHER): Payer: Self-pay

## 2021-09-17 ENCOUNTER — Encounter (HOSPITAL_BASED_OUTPATIENT_CLINIC_OR_DEPARTMENT_OTHER): Payer: Self-pay | Admitting: Nurse Practitioner

## 2021-09-17 DIAGNOSIS — R300 Dysuria: Secondary | ICD-10-CM

## 2021-09-17 MED ORDER — TAMSULOSIN HCL 0.4 MG PO CAPS
0.4000 mg | ORAL_CAPSULE | Freq: Every day | ORAL | 0 refills | Status: DC
  Filled 2021-09-17: qty 21, 21d supply, fill #0

## 2021-09-18 ENCOUNTER — Other Ambulatory Visit (HOSPITAL_BASED_OUTPATIENT_CLINIC_OR_DEPARTMENT_OTHER): Payer: Self-pay

## 2021-09-18 ENCOUNTER — Other Ambulatory Visit (HOSPITAL_BASED_OUTPATIENT_CLINIC_OR_DEPARTMENT_OTHER): Payer: Self-pay | Admitting: Nurse Practitioner

## 2021-09-18 DIAGNOSIS — K649 Unspecified hemorrhoids: Secondary | ICD-10-CM

## 2021-09-18 MED ORDER — HYDROCORTISONE ACETATE 25 MG RE SUPP
25.0000 mg | Freq: Two times a day (BID) | RECTAL | 2 refills | Status: DC | PRN
  Filled 2021-09-18: qty 24, 12d supply, fill #0

## 2021-09-21 ENCOUNTER — Other Ambulatory Visit (HOSPITAL_BASED_OUTPATIENT_CLINIC_OR_DEPARTMENT_OTHER): Payer: Self-pay

## 2021-09-21 ENCOUNTER — Encounter (HOSPITAL_BASED_OUTPATIENT_CLINIC_OR_DEPARTMENT_OTHER): Payer: Self-pay

## 2021-09-24 ENCOUNTER — Other Ambulatory Visit (HOSPITAL_BASED_OUTPATIENT_CLINIC_OR_DEPARTMENT_OTHER): Payer: Self-pay

## 2021-09-28 ENCOUNTER — Other Ambulatory Visit (HOSPITAL_BASED_OUTPATIENT_CLINIC_OR_DEPARTMENT_OTHER): Payer: Self-pay

## 2021-10-03 ENCOUNTER — Other Ambulatory Visit (HOSPITAL_BASED_OUTPATIENT_CLINIC_OR_DEPARTMENT_OTHER): Payer: Self-pay

## 2021-10-04 ENCOUNTER — Other Ambulatory Visit (HOSPITAL_BASED_OUTPATIENT_CLINIC_OR_DEPARTMENT_OTHER): Payer: Self-pay

## 2021-10-08 ENCOUNTER — Other Ambulatory Visit (HOSPITAL_BASED_OUTPATIENT_CLINIC_OR_DEPARTMENT_OTHER): Payer: Self-pay

## 2021-10-09 ENCOUNTER — Other Ambulatory Visit (HOSPITAL_BASED_OUTPATIENT_CLINIC_OR_DEPARTMENT_OTHER): Payer: Self-pay

## 2021-10-16 ENCOUNTER — Telehealth (HOSPITAL_BASED_OUTPATIENT_CLINIC_OR_DEPARTMENT_OTHER): Payer: Self-pay

## 2021-10-16 NOTE — Telephone Encounter (Signed)
Spoke with Derek Erickson to appeal the denial for medication for [facility]. She told me we should receive a fax with the decision within 24hr - 72hrs. ?

## 2021-10-22 ENCOUNTER — Other Ambulatory Visit (HOSPITAL_BASED_OUTPATIENT_CLINIC_OR_DEPARTMENT_OTHER): Payer: Self-pay

## 2021-10-22 ENCOUNTER — Other Ambulatory Visit (HOSPITAL_BASED_OUTPATIENT_CLINIC_OR_DEPARTMENT_OTHER): Payer: Self-pay | Admitting: Nurse Practitioner

## 2021-10-22 DIAGNOSIS — F3340 Major depressive disorder, recurrent, in remission, unspecified: Secondary | ICD-10-CM

## 2021-10-22 DIAGNOSIS — G4700 Insomnia, unspecified: Secondary | ICD-10-CM

## 2021-10-22 DIAGNOSIS — F411 Generalized anxiety disorder: Secondary | ICD-10-CM

## 2021-10-22 DIAGNOSIS — F3181 Bipolar II disorder: Secondary | ICD-10-CM

## 2021-10-22 DIAGNOSIS — F4312 Post-traumatic stress disorder, chronic: Secondary | ICD-10-CM

## 2021-10-23 ENCOUNTER — Telehealth (HOSPITAL_BASED_OUTPATIENT_CLINIC_OR_DEPARTMENT_OTHER): Payer: Self-pay

## 2021-10-23 NOTE — Telephone Encounter (Signed)
Faxed prior authorization over to [place] for appeals fax # [phone] ?

## 2021-10-25 ENCOUNTER — Telehealth (HOSPITAL_BASED_OUTPATIENT_CLINIC_OR_DEPARTMENT_OTHER): Payer: Self-pay

## 2021-10-25 NOTE — Telephone Encounter (Signed)
Received fax from [place] that Mancel Parsons was approved Prior Programmer, systems # [id number]. Fax given to Judson Roch to scan into the patients chart.  ?

## 2021-10-26 ENCOUNTER — Other Ambulatory Visit (HOSPITAL_BASED_OUTPATIENT_CLINIC_OR_DEPARTMENT_OTHER): Payer: Self-pay

## 2021-10-29 ENCOUNTER — Other Ambulatory Visit (HOSPITAL_BASED_OUTPATIENT_CLINIC_OR_DEPARTMENT_OTHER): Payer: Self-pay

## 2021-11-15 ENCOUNTER — Other Ambulatory Visit (HOSPITAL_BASED_OUTPATIENT_CLINIC_OR_DEPARTMENT_OTHER): Payer: Self-pay

## 2021-11-16 ENCOUNTER — Other Ambulatory Visit (HOSPITAL_BASED_OUTPATIENT_CLINIC_OR_DEPARTMENT_OTHER): Payer: Self-pay | Admitting: Nurse Practitioner

## 2021-11-16 ENCOUNTER — Other Ambulatory Visit (HOSPITAL_BASED_OUTPATIENT_CLINIC_OR_DEPARTMENT_OTHER): Payer: Self-pay

## 2021-11-16 DIAGNOSIS — K625 Hemorrhage of anus and rectum: Secondary | ICD-10-CM

## 2021-11-26 ENCOUNTER — Ambulatory Visit (INDEPENDENT_AMBULATORY_CARE_PROVIDER_SITE_OTHER): Payer: 59 | Admitting: Psychologist

## 2021-11-26 DIAGNOSIS — F330 Major depressive disorder, recurrent, mild: Secondary | ICD-10-CM

## 2021-11-26 DIAGNOSIS — F4310 Post-traumatic stress disorder, unspecified: Secondary | ICD-10-CM | POA: Diagnosis not present

## 2021-11-26 NOTE — Plan of Care (Signed)
Goals ?Alleviate depressive symptoms ?Recognize, accept, and cope with depressive feelings ?Develop healthy thinking patterns ?Develop healthy interpersonal relationships ? ?Objectives ?Cooperate with a medication evaluation by a physician ?Verbalize an accurate understanding of depression ?Verbalize an understanding of the treatment ?Identify and replace thoughts that support depression ?Learn and implement behavioral strategies ?Verbalize an understanding and resolution of current interpersonal problems ?Learn and implement problem solving and decision making skills ?Learn and implement conflict resolution skills to resolve interpersonal problems ?Verbalize an understanding of healthy and unhealthy emotions verbalize insight into how past relationships may be influence current experiences with depression ?Use mindfulness and acceptance strategies and increase value based behavior  ?Increase hopeful statements about the future.  ?Interventions ?Consistent with treatment model, discuss how change in cognitive, behavioral, and interpersonal can help client alleviate depression ?CBT ?Behavioral activation help the client explore the relationship, nature of the dispute,  ?Help the client develop new interpersonal skills and relationships ?Conduct Problem so living therapy ?Teach conflict resolution skills ?Use a process-experiential approach ?Conduct TLDP ?Conduct ACT ?Evaluate need for psychotropic medication ?Monitor adherence to medication  ? ?Goals ?Reduce overall frequency, intensity, and duration of trauma related symptoms ?Stabilize  trauma while increasing ability to function ?Enhance ability to effectively cope with triggers, reminders, flashbacks, moods, and cognitions of trauma ?Learn and implement coping skills that result in a reduction of trauma related symptomsl ? ?Objectives ?Verbalize an understanding of the cognitive, physiological, and behavioral components of post traumatic stress disorder ?Describe  in detail reminders of the triggers related to trauma  ?Learn and implement strategies to manage thoughts, behaviors, and feelings of trauma ?Learning and implement calming skills to reduce overall trauma ?Verbalize an understanding of the role that cognitive biases play in excessive irrational worry and persistent trauma symptoms ?Learn and implement personal skills to manage challenging situations ?Learn and implement problem solving strategies ?Identify and engage in pleasant activities ?Learn to accept limitations in life and commit to tolerating, rather than avoiding, unpleasant emotions while accomplishing meaningful goals ?Maintain involvement in work, family, and social activities ?Reestablish a consistent sleep-wake cycle ?Cooperate with a medical evaluation  ?Interventions ?Engage the patient in behavioral activation ?Use instruction, modeling, and role-playing to build the client's general social, communication, and/or conflict resolution skills ?Use Acceptance and Commitment Therapy to help client accept uncomfortable realities in order to accomplish value-consistent goals ?Support the client in following through with work, family, and social activities ?Teach and implement sleep hygiene practices  ?Refer the patient to a physician for a psychotropic medication consultation ?Monito the clint's psychotropic medication compliance ?Discuss how trauma typically involves excessive worry, various bodily expressions of tension, and avoidance of what is threatening that interact to maintain the problem  ?Teach the patient relaxation skills ?Assign the patient homework ?Discuss examples demonstrating that unrealistic worry overestimates the probability of threats and underestimates patient's ability  ?Assist the patient in analyzing his or her worries ?Help patient understand that avoidance is reinforcing  ? ? ?

## 2021-11-26 NOTE — Progress Notes (Signed)
                Niurka Benecke, PsyD 

## 2021-11-26 NOTE — Progress Notes (Signed)
[facility] Counselor Initial Adult Exam ? ?Name: Derek Erickson ?Date: 11/26/2021 ?MRN: [id number] ?DOB: Jun 04, 1976 ?PCP: Tollie Eth, NP ? ?Time spent: 1:09 pm to 1:50 pm; total time: 41 minutes ? ?This session was held via in person. The patient consented to in-person therapy and was in the clinician's office. Limits of confidentiality were discussed with the patient.  ? ?Guardian/Payee:  NA   ? ?Paperwork requested: No  ? ?Reason for Visit /Presenting Problem: PTSD and depression ? ?Mental Status Exam: ?Appearance:   Well Groomed     ?Behavior:  Appropriate  ?Motor:  Normal  ?Speech/Language:   Clear and Coherent  ?Affect:  Appropriate  ?Mood:  normal  ?Thought process:  normal  ?Thought content:    WNL  ?Sensory/Perceptual disturbances:    WNL  ?Orientation:  oriented to person, place, and time/date  ?Attention:  Good  ?Concentration:  Good  ?Memory:  WNL  ?Fund of knowledge:   Good  ?Insight:    Fair  ?Judgment:   Good  ?Impulse Control:  Good  ? ? ? ?Reported Symptoms:  The patient endorsed experiencing the following: nightmares, flashbacks, hyperarousal, experiencing the traumatic event, changes in mood and cognition, irritability, and dissociation. He denied suicidal and homicidal ideation. ? ?The patient endorsed experiencing the following: feeling down, sad, tearful, social isolation, avoiding pleasurable activities, rumination of negative thoughts, and low self-esteem. He denied suicidal and homicidal ideation.  ? ?Risk Assessment: ?Danger to Self:  No ?Self-injurious Behavior: No ?Danger to Others: No ?Duty to Warn:no ?Physical Aggression / Violence:No  ?Access to Firearms a concern: No  ?Gang Involvement:No  ?Patient / guardian was educated about steps to take if suicide or homicide risk level increases between visits: n/a ?While future psychiatric events cannot be accurately predicted, the patient does not currently require acute inpatient psychiatric care and does not currently meet [facility] involuntary commitment criteria. ? ?Substance Abuse History: ?Current substance abuse:  Patient dips nicotine daily. States he goes through one to two cans daily.     ? ?Past Psychiatric History:   ?Previous psychological history is significant for PTSD ?Outpatient Providers:NA ?History of Psych Hospitalization: No  ?Psychological Testing:  NA   ? ?Abuse History:  ?Victim of: No.,  NA    ?Report needed: No. ?Victim of Neglect:No. ?Perpetrator of  NA   ?Witness / Exposure to Domestic Violence: No   ?Protective Services Involvement: No  ?Witness to [employer] Violence:  No  ? ?Family History:  ?Family History  ?Problem Relation Age of Onset  ? Diabetes Mother   ? Heart disease Father   ? Dementia Maternal Grandmother   ? Cancer Maternal Grandfather   ? ? ?Living situation: the patient lives with their family ? ?Sexual Orientation: Straight ? ?Relationship Status: married  ?Name of spouse / other:Cyndi ?If a parent, number of children / ages:Ash who is 16  ? ?Support Systems: spouse ? ?Financial Stress:  No  ? ?Income/Employment/Disability: Employment ? ?Military Service: Yes  ? 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 ?Religion/Sprituality/World View: ?Christian ? ?Any cultural differences that may affect / interfere with treatment:  not applicable  ? ?Recreation/Hobbies: Playing computer games ? ?Stressors: Traumatic event   ? ?Strengths: Supportive Relationships ? ?Barriers:  NA  ? ?Legal History: ?Pending legal issue / charges: The patient has no significant history of legal issues. ?History of legal issue / charges:  NA ? ?Medical History/Surgical History: reviewed ?Past Medical History:  ?Diagnosis Date  ? Anxiety   ?  Depression   ? Rash of finger 07/10/2021  ? ? ?No past surgical history on file. ? ?Medications: ?Current Outpatient Medications  ?Medication Sig Dispense Refill  ? ARIPiprazole (ABILIFY) 15 MG tablet Take 1 tablet by mouth daily.    ? citalopram (CELEXA) 40 MG tablet Take 1  tablet by mouth daily.    ? COVID-19 mRNA bivalent vaccine, Moderna, (MODERNA COVID-19 BIVAL BOOSTER) 50 MCG/0.5ML injection Inject into the muscle. 0.5 mL 0  ? hydrocortisone (ANUSOL-HC) 25 MG suppository Place 1 suppository (25 mg total) rectally 2 (two) times daily as needed for hemorrhoids. 24 suppository 2  ? ondansetron (ZOFRAN) 8 MG tablet Take 1 tablet (8 mg total) by mouth every 8 (eight) hours as needed for nausea or vomiting. 20 tablet 2  ? phentermine 37.5 MG capsule One capsule by mouth qAM 30 capsule 0  ? phentermine 37.5 MG capsule One capsule by mouth qAM 30 capsule 0  ? phentermine 37.5 MG capsule One capsule by mouth qAM 30 capsule 0  ? Semaglutide-Weight Management 1 MG/0.5ML SOAJ Inject 1 mg into the skin once a week for 28 days. 2 mL 3  ? tamsulosin (FLOMAX) 0.4 MG CAPS capsule Take 1 capsule (0.4 mg total) by mouth daily. 21 capsule 0  ? traZODone (DESYREL) 100 MG tablet Take 100 mg by mouth at bedtime.    ? ?No current facility-administered medications for this visit.  ? ? ?Allergies  ?Allergen Reactions  ? Prazosin Other (See Comments)  ?  LOC and Dizziness ?  ? ? ?Diagnoses:  ?F43.10 posttraumatic stress disorder and F33.0 major depressive affective disorder, recurrent, mild ? ?Plan of Care: The patient is a 46 year old Caucasian male who was referred due to experiencing PTSD. Patient lives at home with wife, daughter, and several animals. The patient meets criteria for a diagnosis of F43.10 posttraumatic stress disorder based off of the following: nightmares, flashbacks, hyperarousal, experiencing the traumatic event, changes in mood and cognition, irritability, and dissociation. He denied suicidal and homicidal ideation. The patient meets criteria for a diagnosis of F33.0 major depressive affective disorder, recurrent, mild based off of the following: feeling down, sad, tearful, social isolation, avoiding pleasurable activities, rumination of negative thoughts, and low self-esteem. He  denied suicidal and homicidal ideation.  ? ?Patient stated that he wants to start with coping skills ? ?This psychologist makes the recommendation that patient participate in therapy at least once a month.  ? ? ?Hilbert Corrigan, PsyD  ? ? ? ?

## 2021-12-10 ENCOUNTER — Other Ambulatory Visit (HOSPITAL_BASED_OUTPATIENT_CLINIC_OR_DEPARTMENT_OTHER): Payer: Self-pay

## 2021-12-14 ENCOUNTER — Ambulatory Visit (HOSPITAL_BASED_OUTPATIENT_CLINIC_OR_DEPARTMENT_OTHER): Payer: 59 | Admitting: Nurse Practitioner

## 2021-12-14 ENCOUNTER — Other Ambulatory Visit (HOSPITAL_BASED_OUTPATIENT_CLINIC_OR_DEPARTMENT_OTHER): Payer: Self-pay

## 2021-12-14 ENCOUNTER — Encounter (HOSPITAL_BASED_OUTPATIENT_CLINIC_OR_DEPARTMENT_OTHER): Payer: Self-pay | Admitting: Pharmacist

## 2021-12-14 DIAGNOSIS — G43001 Migraine without aura, not intractable, with status migrainosus: Secondary | ICD-10-CM | POA: Diagnosis not present

## 2021-12-14 DIAGNOSIS — F520 Hypoactive sexual desire disorder: Secondary | ICD-10-CM

## 2021-12-14 DIAGNOSIS — E559 Vitamin D deficiency, unspecified: Secondary | ICD-10-CM

## 2021-12-14 DIAGNOSIS — E291 Testicular hypofunction: Secondary | ICD-10-CM

## 2021-12-14 DIAGNOSIS — R531 Weakness: Secondary | ICD-10-CM

## 2021-12-14 DIAGNOSIS — R5383 Other fatigue: Secondary | ICD-10-CM | POA: Diagnosis not present

## 2021-12-14 DIAGNOSIS — E538 Deficiency of other specified B group vitamins: Secondary | ICD-10-CM

## 2021-12-14 MED ORDER — VERAPAMIL HCL 40 MG PO TABS
40.0000 mg | ORAL_TABLET | Freq: Three times a day (TID) | ORAL | 1 refills | Status: DC
  Filled 2021-12-14: qty 90, 30d supply, fill #0
  Filled 2022-01-14: qty 90, 30d supply, fill #1
  Filled ????-??-??: fill #1

## 2021-12-14 NOTE — Patient Instructions (Addendum)
It was a pleasure seeing you today. I hope your time spent with [place] was pleasant and helpful. Please let us know if there is anything we can do to improve the service you receive.   I have sent in labs for you- lets see if we can find out what is causing your symptoms.  I have also sent in for verapamil to take three times a day to see if we can get the migraines to stop  The following orders have been placed for you today:  Orders Placed This Encounter  Procedures   CBC with Differential/Platelet    Order Specific Question:   Release to patient    Answer:   Immediate   Comprehensive metabolic panel    Order Specific Question:   Has the patient fasted?    Answer:   Yes    Order Specific Question:   Release to patient    Answer:   Immediate   Iron, TIBC and Ferritin Panel   B12 and Folate Panel   VITAMIN D 25 Hydroxy (Vit-D Deficiency, Fractures)    Order Specific Question:   Release to patient    Answer:   Immediate   Testosterone    Order Specific Question:   Release to patient    Answer:   Immediate     Important Office Information Lab Results If labs were ordered, please note that you will see results through MyChart as soon as they come available from [facility].  It takes up to 5 business days for the results to be routed to me and for me to review them once all of the lab results have come through from [facility]. I will make recommendations based on your results and send these through MyChart or someone from the office will call you to discuss. If your labs are abnormal, we may contact you to schedule a visit to discuss the results and make recommendations.  If you have not heard from [place] within 5 business days or you have waited longer than a week and your lab results have not come through on MyChart, please feel free to call the office or send a message through MyChart to follow-up on these labs.   Referrals If referrals were placed today, the office where the referral was sent  will contact you either by phone or through MyChart to set up scheduling. Please note that it can take up to a week for the referral office to contact you. If you do not hear from them in a week, please contact the referral office directly to inquire about scheduling.   Condition Treated If your condition worsens or you begin to have new symptoms, please schedule a follow-up appointment for further evaluation. If you are not sure if an appointment is needed, you may call the office to leave a message for the nurse and someone will contact you with recommendations.  If you have an urgent or life threatening emergency, please do not call the office, but seek emergency evaluation by calling 911 or going to the nearest emergency room for evaluation.   MyChart and Phone Calls Please do not use MyChart for urgent messages. It may take up to 3 business days for MyChart messages to be read by staff and if they are unable to handle the request, an additional 3 business days for them to be routed to me and for my response.  Messages sent to the provider through MyChart do not come directly to the provider, please allow time  for these messages to be routed and for me to respond.  We get a large volume of MyChart messages daily and these are responded to in the order received.   For urgent messages, please call the office at [phone] and speak with the front office staff or leave a message on the line of my assistant for guidance.  We are seeing patients from the hours of 8:00 am through 5:00 pm and calls directly to the nurse may not be answered immediately due to seeing patients, but your call will be returned as soon as possible.  Phone  messages received after 4:00 PM Monday through Thursday may not be returned until the following business day. Phone messages received after 11:00 AM on Friday may not be returned until Monday.   After Hours We share on call hours with providers from other offices. If you  have an urgent need after hours that cannot wait until the next business day, please contact the on call provider by calling the office number. A nurse will speak with you and contact the provider if needed for recommendations.  If you have an urgent or life threatening emergency after hours, please do not call the on call provider, but seek emergency evaluation by calling 911 or going to the nearest emergency room for evaluation.   Paperwork All paperwork requires a minimum of 5 days to complete and return to you or the designated personnel. Please keep this in mind when bringing in forms or sending requests for paperwork completion to the office.

## 2021-12-14 NOTE — Progress Notes (Signed)
[name], DNP, AGNP-c [facility] [address] [place], [place] [address] [phone] Office [phone] Fax  ESTABLISHED PATIENT- Chronic Health and/or Follow-Up Visit  There were no vitals taken for this visit.  Headache (On and off for you years/) and low energy (Low energy levels for weeks now. Not sleeping well. No changes known. /)   HPI  Derek Erickson  is a 46 y.o. year old male presenting today for evaluation and management of the following: Generalized symptoms of fatigue, insomnia, decreased sexual desire, and weakness.  Symptoms present for several weeks No recent illness  No recent changes in medications Good sleep hygiene Working to improve diet and lose weight No CP, ShOB, edema, confusion.  Some paresthesias to the hands Migraines Recurrent, frequent migraine headaches Do typically resolve with rest and OTC treatment Occurring 2-3 times a week Not sleeping well Has been on preventative treatment in the past with verapamil, which was helpful  ROS All ROS negative with exception of what is listed in HPI  PHYSICAL EXAM Physical Exam Vitals and nursing note reviewed.  Constitutional:      General: He is not in acute distress.    Appearance: He is well-developed.  HENT:     Head: Normocephalic.  Eyes:     General: No visual field deficit.    Extraocular Movements: Extraocular movements intact.     Right eye: Normal extraocular motion and no nystagmus.     Left eye: Normal extraocular motion and no nystagmus.     Pupils: Pupils are equal, round, and reactive to light.  Cardiovascular:     Rate and Rhythm: Normal rate and regular rhythm.     Heart sounds: Normal heart sounds.  Pulmonary:     Effort: Pulmonary effort is normal.     Breath sounds: Normal breath sounds.  Abdominal:     General: Bowel sounds are normal.     Palpations: Abdomen is soft.  Musculoskeletal:        General: Normal range of  motion.     Cervical back: Normal range of motion.  Lymphadenopathy:     Cervical: No cervical adenopathy.  Skin:    General: Skin is warm and dry.  Neurological:     Mental Status: He is alert and oriented to person, place, and time.     Cranial Nerves: No cranial nerve deficit, dysarthria or facial asymmetry.     Sensory: No sensory deficit.     Motor: No weakness.     Coordination: Coordination normal.     Gait: Gait normal.  Psychiatric:        Mood and Affect: Mood normal.        Speech: Speech normal.        Behavior: Behavior normal.    ASSESSMENT & PLAN Problem List Items Addressed This Visit     Fatigue - Primary    Generalized symptoms of unknown etiology. Will plan to evaluate labs today for possible causes of symptoms. Concern for worsening mood in the setting of bipolar history. At this time no alarm sx present. Will monitor labs and make changes to [facility] based on findings.        Relevant Orders   CBC with Differential/Platelet (Completed)   Comprehensive metabolic panel (Completed)   Iron, TIBC and Ferritin Panel (Completed)   B12 and Folate Panel (Completed)   VITAMIN D 25 Hydroxy (Vit-D Deficiency, Fractures) (Completed)   Testosterone (Completed)   Folate deficiency  Labs show deficiency in folate levels. Recommend daily folic acid supplement of 2-3mg  a day to improve levels. Suspect this deficiency is contributing to symptomology.        Testosterone deficiency in male    Initial testosterone levels significantly decreased. Symptoms consistent with this finding. Will plan to repeat testosterone with AM lab this week and evaluate for replacement to see if this is helpful in reducing symptoms.        Relevant Orders   Testosterone, Free, Total, SHBG   Other Visit Diagnoses     Weakness       Relevant Orders   CBC with Differential/Platelet (Completed)   Comprehensive metabolic panel (Completed)   Iron, TIBC and Ferritin Panel (Completed)   B12  and Folate Panel (Completed)   VITAMIN D 25 Hydroxy (Vit-D Deficiency, Fractures) (Completed)   Testosterone (Completed)   Decreased sexual desire       Relevant Orders   CBC with Differential/Platelet (Completed)   Comprehensive metabolic panel (Completed)   Iron, TIBC and Ferritin Panel (Completed)   B12 and Folate Panel (Completed)   VITAMIN D 25 Hydroxy (Vit-D Deficiency, Fractures) (Completed)   Testosterone (Completed)   Migraine without aura and with status migrainosus, not intractable       Relevant Medications   verapamil (CALAN) 40 MG tablet   Vitamin D deficiency       Relevant Medications   Vitamin D, Ergocalciferol, (DRISDOL) 1.25 MG (50000 UNIT) CAPS capsule        FOLLOW-UP No follow-ups on file.    [name], DNP, AGNP-c

## 2021-12-15 LAB — CBC WITH DIFFERENTIAL/PLATELET
Basophils Absolute: 0.1 10*3/uL (ref 0.0–0.2)
Basos: 1 %
EOS (ABSOLUTE): 1.4 10*3/uL — ABNORMAL HIGH (ref 0.0–0.4)
Eos: 14 %
Hematocrit: 49 % (ref 37.5–51.0)
Hemoglobin: 16.6 g/dL (ref 13.0–17.7)
Immature Grans (Abs): 0 10*3/uL (ref 0.0–0.1)
Immature Granulocytes: 0 %
Lymphocytes Absolute: 2.8 10*3/uL (ref 0.7–3.1)
Lymphs: 27 %
MCH: 30.2 pg (ref 26.6–33.0)
MCHC: 33.9 g/dL (ref 31.5–35.7)
MCV: 89 fL (ref 79–97)
Monocytes Absolute: 0.5 10*3/uL (ref 0.1–0.9)
Monocytes: 5 %
Neutrophils Absolute: 5.6 10*3/uL (ref 1.4–7.0)
Neutrophils: 53 %
Platelets: 397 10*3/uL (ref 150–450)
RBC: 5.49 x10E6/uL (ref 4.14–5.80)
RDW: 13.6 % (ref 11.6–15.4)
WBC: 10.5 10*3/uL (ref 3.4–10.8)

## 2021-12-15 LAB — COMPREHENSIVE METABOLIC PANEL
ALT: 36 IU/L (ref 0–44)
AST: 27 IU/L (ref 0–40)
Albumin/Globulin Ratio: 1.7 (ref 1.2–2.2)
Albumin: 4.7 g/dL (ref 4.0–5.0)
Alkaline Phosphatase: 78 IU/L (ref 44–121)
BUN/Creatinine Ratio: 7 — ABNORMAL LOW (ref 9–20)
BUN: 6 mg/dL (ref 6–24)
Bilirubin Total: 0.4 mg/dL (ref 0.0–1.2)
CO2: 18 mmol/L — ABNORMAL LOW (ref 20–29)
Calcium: 9.8 mg/dL (ref 8.7–10.2)
Chloride: 100 mmol/L (ref 96–106)
Creatinine, Ser: 0.89 mg/dL (ref 0.76–1.27)
Globulin, Total: 2.7 g/dL (ref 1.5–4.5)
Glucose: 114 mg/dL — ABNORMAL HIGH (ref 70–99)
Potassium: 4.2 mmol/L (ref 3.5–5.2)
Sodium: 138 mmol/L (ref 134–144)
Total Protein: 7.4 g/dL (ref 6.0–8.5)
eGFR: 108 mL/min/{1.73_m2} (ref >59)

## 2021-12-15 LAB — B12 AND FOLATE PANEL
Folate: 2.8 ng/mL — ABNORMAL LOW (ref >3.0)
Vitamin B-12: 730 pg/mL (ref 232–1245)

## 2021-12-15 LAB — IRON,TIBC AND FERRITIN PANEL
Ferritin: 109 ng/mL (ref 30–400)
Iron Saturation: 20 % (ref 15–55)
Iron: 62 ug/dL (ref 38–169)
Total Iron Binding Capacity: 313 ug/dL (ref 250–450)
UIBC: 251 ug/dL (ref 111–343)

## 2021-12-15 LAB — TESTOSTERONE: Testosterone: 193 ng/dL — ABNORMAL LOW (ref 264–916)

## 2021-12-15 LAB — VITAMIN D 25 HYDROXY (VIT D DEFICIENCY, FRACTURES): Vit D, 25-Hydroxy: 19.5 ng/mL — ABNORMAL LOW (ref 30.0–100.0)

## 2021-12-17 ENCOUNTER — Other Ambulatory Visit (HOSPITAL_BASED_OUTPATIENT_CLINIC_OR_DEPARTMENT_OTHER): Payer: Self-pay

## 2021-12-18 ENCOUNTER — Other Ambulatory Visit (HOSPITAL_BASED_OUTPATIENT_CLINIC_OR_DEPARTMENT_OTHER): Payer: Self-pay

## 2021-12-18 MED ORDER — VITAMIN D (ERGOCALCIFEROL) 1.25 MG (50000 UNIT) PO CAPS
50000.0000 [IU] | ORAL_CAPSULE | ORAL | 0 refills | Status: DC
  Filled 2021-12-18: qty 12, 84d supply, fill #0

## 2021-12-19 ENCOUNTER — Encounter (HOSPITAL_BASED_OUTPATIENT_CLINIC_OR_DEPARTMENT_OTHER): Payer: Self-pay | Admitting: Nurse Practitioner

## 2021-12-19 ENCOUNTER — Other Ambulatory Visit (HOSPITAL_BASED_OUTPATIENT_CLINIC_OR_DEPARTMENT_OTHER): Payer: Self-pay

## 2021-12-19 ENCOUNTER — Other Ambulatory Visit (HOSPITAL_BASED_OUTPATIENT_CLINIC_OR_DEPARTMENT_OTHER): Payer: Self-pay | Admitting: Nurse Practitioner

## 2021-12-19 DIAGNOSIS — E291 Testicular hypofunction: Secondary | ICD-10-CM | POA: Insufficient documentation

## 2021-12-19 DIAGNOSIS — G43001 Migraine without aura, not intractable, with status migrainosus: Secondary | ICD-10-CM | POA: Insufficient documentation

## 2021-12-19 DIAGNOSIS — R5383 Other fatigue: Secondary | ICD-10-CM | POA: Insufficient documentation

## 2021-12-19 DIAGNOSIS — E538 Deficiency of other specified B group vitamins: Secondary | ICD-10-CM

## 2021-12-19 HISTORY — DX: Other fatigue: R53.83

## 2021-12-19 HISTORY — DX: Deficiency of other specified B group vitamins: E53.8

## 2021-12-19 NOTE — Assessment & Plan Note (Signed)
Initial testosterone levels significantly decreased. Symptoms consistent with this finding. Will plan to repeat testosterone with AM lab this week and evaluate for replacement to see if this is helpful in reducing symptoms.

## 2021-12-19 NOTE — Assessment & Plan Note (Signed)
Chronic migraines with frequent recurrence. No alarm sx present at this time. Exam benign. Will restart verapamil at low dose and titrate as tolerated to see if we can get the migraines to lessen in frequency. Patient advised to monitor for sx of low BP. If sx do not improve will plan to titrate medication as BP tolerates. Pt aware that he can reach out for abortive tx if OTC medications are no longer effective. He will f/u if sx do not improve or if new sx present.

## 2021-12-19 NOTE — Assessment & Plan Note (Signed)
Generalized symptoms of unknown etiology. Will plan to evaluate labs today for possible causes of symptoms. Concern for worsening mood in the setting of bipolar history. At this time no alarm sx present. Will monitor labs and make changes to [facility] based on findings.

## 2021-12-19 NOTE — Assessment & Plan Note (Signed)
Labs show deficiency in folate levels. Recommend daily folic acid supplement of 2-3mg  a day to improve levels. Suspect this deficiency is contributing to symptomology.

## 2021-12-21 LAB — TESTOSTERONE, FREE, TOTAL, SHBG
Sex Hormone Binding: 29.4 nmol/L (ref 16.5–55.9)
Testosterone, Free: 7.2 pg/mL (ref 6.8–21.5)
Testosterone: 211 ng/dL — ABNORMAL LOW (ref 264–916)

## 2021-12-26 ENCOUNTER — Ambulatory Visit (INDEPENDENT_AMBULATORY_CARE_PROVIDER_SITE_OTHER): Payer: 59 | Admitting: Psychologist

## 2021-12-26 DIAGNOSIS — F330 Major depressive disorder, recurrent, mild: Secondary | ICD-10-CM

## 2021-12-26 DIAGNOSIS — F4310 Post-traumatic stress disorder, unspecified: Secondary | ICD-10-CM

## 2021-12-26 NOTE — Progress Notes (Signed)
[facility] Counselor/Therapist Progress Note  Patient ID: Derek Erickson, MRN: [id number],    Date: 12/26/2021  Time Spent: 1:07 pm to 1:45 pm; total time: 38 minutes   This session was held via in person. The patient consented to in-person therapy and was in the clinician's office. Limits of confidentiality were discussed with the patient.   Treatment Type: Individual Therapy  Reported Symptoms: Distress related to rumination of negative thoughts  Mental Status Exam: Appearance:  Well Groomed     Behavior: Appropriate  Motor: Normal  Speech/Language:  Clear and Coherent  Affect: Appropriate  Mood: normal  Thought process: normal  Thought content:   WNL  Sensory/Perceptual disturbances:   WNL  Orientation: oriented to person, place, and time/date  Attention: Good  Concentration: Good  Memory: WNL  Fund of knowledge:  Good  Insight:   Good  Judgment:  Good  Impulse Control: Good   Risk Assessment: Danger to Self:  No Self-injurious Behavior: No Danger to Others: No Duty to Warn:no Physical Aggression / Violence:No  Access to Firearms a concern: No  Gang Involvement:No   Subjective: Beginning the session, patient described himself as okay. After reviewing the treatment plan, he reflected on events since the last session. Specifically, he disclosed that it was discovered that he is low in vitamin D, testosterone and folic acid, which may explain fatigue. He indicated that he wanted coping strategies. After discussing, practicing, and processing strategies patient described himself as better. He was agreeable to homework discussed. He denied suicidal and homicidal ideation.    Interventions:  Worked on developing a therapeutic relationship with the patient using active listening and reflective statements. Provided emotional support using empathy and validation. Reviewed the treatment plan with the patient. Reviewed events since the intake occurred. Normalized and  validated expressed thoughts and emotions. Identified goals for the session. Provided psychoeducation about mindfulness, mindful moments, and defusion. Practiced and processed mindfulness and defusion. Praised patient for experiencing less distress. Provided psychoeducation about practicing multiple times daily. Used socratic questions to assist the patient gain insight. Assisted in problem solving. Assigned homework. Assessed for suicidal and homicidal ideation.   Homework: Implement mindfulness and defusion  Next Session: NA  Diagnosis: F43.10 posttraumatic stress disorder and F33.0 major depressive affective disorder, recurrent, mild   Plan:   Goals Alleviate depressive symptoms Recognize, accept, and cope with depressive feelings Develop healthy thinking patterns Develop healthy interpersonal relationships Reduce overall frequency, intensity, and duration of trauma related symptoms Stabilize  trauma while increasing ability to function Enhance ability to effectively cope with triggers, reminders, flashbacks, moods, and cognitions of trauma Learn and implement coping skills that result in a reduction of trauma related symptomsl  Objectives target date for all objectives is 11/27/2022 Cooperate with a medication evaluation by a physician Verbalize an accurate understanding of depression Verbalize an understanding of the treatment Identify and replace thoughts that support depression Learn and implement behavioral strategies Verbalize an understanding and resolution of current interpersonal problems Learn and implement problem solving and decision making skills Learn and implement conflict resolution skills to resolve interpersonal problems Verbalize an understanding of healthy and unhealthy emotions verbalize insight into how past relationships may be influence current experiences with depression Use mindfulness and acceptance strategies and increase value based behavior  Increase  hopeful statements about the future.  Verbalize an understanding of the cognitive, physiological, and behavioral components of post traumatic stress disorder Describe in detail reminders of the triggers related to trauma  Learn and implement strategies  to manage thoughts, behaviors, and feelings of trauma Learning and implement calming skills to reduce overall trauma Verbalize an understanding of the role that cognitive biases play in excessive irrational worry and persistent trauma symptoms Learn and implement personal skills to manage challenging situations Learn and implement problem solving strategies Identify and engage in pleasant activities Learn to accept limitations in life and commit to tolerating, rather than avoiding, unpleasant emotions while accomplishing meaningful goals Maintain involvement in work, family, and social activities Reestablish a consistent sleep-wake cycle Cooperate with a medical evaluation  Interventions Consistent with treatment model, discuss how change in cognitive, behavioral, and interpersonal can help client alleviate depression CBT Behavioral activation help the client explore the relationship, nature of the dispute,  Help the client develop new interpersonal skills and relationships Conduct Problem so living therapy Teach conflict resolution skills Use a process-experiential approach Conduct TLDP Conduct ACT Evaluate need for psychotropic medication Monitor adherence to medication  Engage the patient in behavioral activation Use instruction, modeling, and role-playing to build the client's general social, communication, and/or conflict resolution skills Use Acceptance and Commitment Therapy to help client accept uncomfortable realities in order to accomplish value-consistent goals Support the client in following through with work, family, and social activities Teach and implement sleep hygiene practices  Refer the patient to a physician for a  psychotropic medication consultation Monito the clint's psychotropic medication compliance Discuss how trauma typically involves excessive worry, various bodily expressions of tension, and avoidance of what is threatening that interact to maintain the problem  Teach the patient relaxation skills Assign the patient homework Discuss examples demonstrating that unrealistic worry overestimates the probability of threats and underestimates patient's ability  Assist the patient in analyzing his or her worries Help patient understand that avoidance is reinforcing   The patient and clinician reviewed the treatment plan on 12/26/2021. The patient approved of the treatment plan.   Hilbert Corrigan, PsyD

## 2021-12-26 NOTE — Progress Notes (Signed)
                Rain Friedt, PsyD 

## 2021-12-27 ENCOUNTER — Encounter (HOSPITAL_BASED_OUTPATIENT_CLINIC_OR_DEPARTMENT_OTHER): Payer: Self-pay | Admitting: Nurse Practitioner

## 2021-12-31 ENCOUNTER — Other Ambulatory Visit (HOSPITAL_BASED_OUTPATIENT_CLINIC_OR_DEPARTMENT_OTHER): Payer: Self-pay | Admitting: Nurse Practitioner

## 2021-12-31 ENCOUNTER — Other Ambulatory Visit (HOSPITAL_BASED_OUTPATIENT_CLINIC_OR_DEPARTMENT_OTHER): Payer: Self-pay

## 2021-12-31 DIAGNOSIS — E291 Testicular hypofunction: Secondary | ICD-10-CM

## 2021-12-31 MED ORDER — "BD LUER-LOK SYRINGE 22G X 1"" 3 ML MISC"
0 refills | Status: DC
  Filled 2021-12-31 – 2022-01-24 (×2): qty 25, 25d supply, fill #0

## 2021-12-31 MED ORDER — TESTOSTERONE CYPIONATE 100 MG/ML IM SOLN
100.0000 mg | INTRAMUSCULAR | 0 refills | Status: DC
  Filled 2021-12-31: qty 10, 140d supply, fill #0
  Filled 2022-01-01: qty 10, 28d supply, fill #0

## 2021-12-31 MED ORDER — "NEEDLE (DISP) 22G X 1-1/2"" MISC"
0 refills | Status: DC
  Filled 2021-12-31 – 2022-01-24 (×2): qty 25, fill #0

## 2022-01-01 ENCOUNTER — Other Ambulatory Visit (HOSPITAL_BASED_OUTPATIENT_CLINIC_OR_DEPARTMENT_OTHER): Payer: Self-pay

## 2022-01-01 ENCOUNTER — Other Ambulatory Visit (HOSPITAL_BASED_OUTPATIENT_CLINIC_OR_DEPARTMENT_OTHER): Payer: Self-pay | Admitting: Nurse Practitioner

## 2022-01-01 DIAGNOSIS — E291 Testicular hypofunction: Secondary | ICD-10-CM

## 2022-01-01 MED ORDER — TESTOSTERONE CYPIONATE 200 MG/ML IM SOLN
100.0000 mg | INTRAMUSCULAR | 5 refills | Status: DC
  Filled 2022-01-01: qty 2, 28d supply, fill #0
  Filled 2022-01-23 – 2022-01-24 (×2): qty 2, 28d supply, fill #1

## 2022-01-03 ENCOUNTER — Telehealth (HOSPITAL_BASED_OUTPATIENT_CLINIC_OR_DEPARTMENT_OTHER): Payer: Self-pay

## 2022-01-03 ENCOUNTER — Other Ambulatory Visit (HOSPITAL_BASED_OUTPATIENT_CLINIC_OR_DEPARTMENT_OTHER): Payer: Self-pay

## 2022-01-03 DIAGNOSIS — G43001 Migraine without aura, not intractable, with status migrainosus: Secondary | ICD-10-CM

## 2022-01-03 MED ORDER — NURTEC 75 MG PO TBDP
ORAL_TABLET | ORAL | 11 refills | Status: DC
  Filled 2022-01-03: qty 16, 30d supply, fill #0
  Filled 2022-04-10: qty 8, 30d supply, fill #1
  Filled 2022-05-21: qty 8, 16d supply, fill #2

## 2022-01-04 ENCOUNTER — Telehealth (HOSPITAL_BASED_OUTPATIENT_CLINIC_OR_DEPARTMENT_OTHER): Payer: Self-pay | Admitting: Nurse Practitioner

## 2022-01-04 NOTE — Telephone Encounter (Signed)
Received fax transmission from pt's ins company approving coverage for Testosterone Cypionate 200 mg/ml. Documents will be in provider's yellow dot tray for review. Please advise.

## 2022-01-07 ENCOUNTER — Other Ambulatory Visit (HOSPITAL_BASED_OUTPATIENT_CLINIC_OR_DEPARTMENT_OTHER): Payer: Self-pay

## 2022-01-11 ENCOUNTER — Other Ambulatory Visit (HOSPITAL_BASED_OUTPATIENT_CLINIC_OR_DEPARTMENT_OTHER): Payer: Self-pay

## 2022-01-14 ENCOUNTER — Other Ambulatory Visit (HOSPITAL_BASED_OUTPATIENT_CLINIC_OR_DEPARTMENT_OTHER): Payer: Self-pay

## 2022-01-14 ENCOUNTER — Telehealth: Payer: Self-pay

## 2022-01-14 NOTE — Telephone Encounter (Signed)
Outcome Approvedon June 9 The request has been approved. The authorization is effective for a maximum of 13 fills from 01/04/2022 to 01/04/2023, as long as the member is enrolled in their current health plan. The request was approved as submitted. A written notification letter will follow with additional details. Drug Testosterone Cypionate 200MG /ML intramuscular solution Form [employer] ePA Form 2017 NCPDP Original Claim Info 75 TRANS FEE = 0.00PRIOR AUTH REQUIRED/INVALID; PA REQUESTS: [id number]

## 2022-01-23 ENCOUNTER — Other Ambulatory Visit (HOSPITAL_BASED_OUTPATIENT_CLINIC_OR_DEPARTMENT_OTHER): Payer: Self-pay

## 2022-01-24 ENCOUNTER — Other Ambulatory Visit (HOSPITAL_BASED_OUTPATIENT_CLINIC_OR_DEPARTMENT_OTHER): Payer: Self-pay

## 2022-01-24 MED ORDER — SEMAGLUTIDE-WEIGHT MANAGEMENT 1.7 MG/0.75ML ~~LOC~~ SOAJ
1.7000 mg | SUBCUTANEOUS | 0 refills | Status: DC
  Filled 2022-01-24: qty 3, 28d supply, fill #0

## 2022-01-27 ENCOUNTER — Emergency Department (HOSPITAL_BASED_OUTPATIENT_CLINIC_OR_DEPARTMENT_OTHER): Payer: 59

## 2022-01-27 ENCOUNTER — Encounter (HOSPITAL_BASED_OUTPATIENT_CLINIC_OR_DEPARTMENT_OTHER): Payer: Self-pay | Admitting: Emergency Medicine

## 2022-01-27 ENCOUNTER — Other Ambulatory Visit: Payer: Self-pay

## 2022-01-27 ENCOUNTER — Emergency Department (HOSPITAL_BASED_OUTPATIENT_CLINIC_OR_DEPARTMENT_OTHER)
Admission: EM | Admit: 2022-01-27 | Discharge: 2022-01-27 | Disposition: A | Discharge status: Home / Self Care | Payer: 59 | Attending: Emergency Medicine | Admitting: Emergency Medicine

## 2022-01-27 DIAGNOSIS — R0602 Shortness of breath: Secondary | ICD-10-CM | POA: Insufficient documentation

## 2022-01-27 DIAGNOSIS — R079 Chest pain, unspecified: Secondary | ICD-10-CM | POA: Diagnosis not present

## 2022-01-27 DIAGNOSIS — R110 Nausea: Secondary | ICD-10-CM | POA: Insufficient documentation

## 2022-01-27 DIAGNOSIS — R0789 Other chest pain: Secondary | ICD-10-CM | POA: Diagnosis not present

## 2022-01-27 DIAGNOSIS — R748 Abnormal levels of other serum enzymes: Secondary | ICD-10-CM | POA: Insufficient documentation

## 2022-01-27 LAB — COMPREHENSIVE METABOLIC PANEL
ALT: 26 U/L (ref 0–44)
AST: 20 U/L (ref 15–41)
Albumin: 4.4 g/dL (ref 3.5–5.0)
Alkaline Phosphatase: 41 U/L (ref 38–126)
Anion gap: 9 (ref 5–15)
BUN: 12 mg/dL (ref 6–20)
CO2: 26 mmol/L (ref 22–32)
Calcium: 10.2 mg/dL (ref 8.9–10.3)
Chloride: 100 mmol/L (ref 98–111)
Creatinine, Ser: 0.86 mg/dL (ref 0.61–1.24)
GFR, Estimated: >60 mL/min (ref >60)
Glucose, Bld: 99 mg/dL (ref 70–99)
Potassium: 4.1 mmol/L (ref 3.5–5.1)
Sodium: 135 mmol/L (ref 135–145)
Total Bilirubin: 0.3 mg/dL (ref 0.3–1.2)
Total Protein: 7.3 g/dL (ref 6.5–8.1)

## 2022-01-27 LAB — CBC WITH DIFFERENTIAL/PLATELET
Abs Immature Granulocytes: 0.04 10*3/uL (ref 0.00–0.07)
Basophils Absolute: 0.1 10*3/uL (ref 0.0–0.1)
Basophils Relative: 1 %
Eosinophils Absolute: 0.4 10*3/uL (ref 0.0–0.5)
Eosinophils Relative: 3 %
HCT: 43.7 % (ref 39.0–52.0)
Hemoglobin: 14.5 g/dL (ref 13.0–17.0)
Immature Granulocytes: 0 %
Lymphocytes Relative: 15 %
Lymphs Abs: 1.7 10*3/uL (ref 0.7–4.0)
MCH: 30 pg (ref 26.0–34.0)
MCHC: 33.2 g/dL (ref 30.0–36.0)
MCV: 90.5 fL (ref 80.0–100.0)
Monocytes Absolute: 0.7 10*3/uL (ref 0.1–1.0)
Monocytes Relative: 6 %
Neutro Abs: 8.7 10*3/uL — ABNORMAL HIGH (ref 1.7–7.7)
Neutrophils Relative %: 75 %
Platelets: 366 10*3/uL (ref 150–400)
RBC: 4.83 MIL/uL (ref 4.22–5.81)
RDW: 13.2 % (ref 11.5–15.5)
WBC: 11.7 10*3/uL — ABNORMAL HIGH (ref 4.0–10.5)
nRBC: 0 % (ref 0.0–0.2)

## 2022-01-27 LAB — TROPONIN I (HIGH SENSITIVITY)
Troponin I (High Sensitivity): 2 ng/L (ref <18)
Troponin I (High Sensitivity): 2 ng/L (ref <18)

## 2022-01-27 LAB — LIPASE, BLOOD: Lipase: 67 U/L — ABNORMAL HIGH (ref 11–51)

## 2022-01-27 MED ORDER — ONDANSETRON HCL 4 MG/2ML IJ SOLN
4.0000 mg | Freq: Once | INTRAMUSCULAR | Status: AC
  Administered 2022-01-27: 4 mg via INTRAVENOUS
  Filled 2022-01-27: qty 2

## 2022-01-27 MED ORDER — MORPHINE SULFATE (PF) 4 MG/ML IV SOLN
4.0000 mg | Freq: Once | INTRAVENOUS | Status: AC
  Administered 2022-01-27: 4 mg via INTRAVENOUS
  Filled 2022-01-27: qty 1

## 2022-01-27 NOTE — Discharge Instructions (Addendum)
Call your primary care doctor or specialist as discussed in the next 2-3 days.   Return immediately back to the ER if:  Your symptoms worsen within the next 12-24 hours. You develop new symptoms such as new fevers, persistent vomiting, new pain, shortness of breath, or new weakness or numbness, or if you have any other concerns.  

## 2022-01-27 NOTE — ED Triage Notes (Signed)
Patient c/o chest pain onset 3 am. Patient tried soda and Alkaseltzer without relief. Patient reports other symptoms of sweating and nausea.

## 2022-01-27 NOTE — ED Provider Notes (Addendum)
[facility] EMERGENCY DEPT Provider Note   CSN: [id number] Arrival date & time: 01/27/22  1225     History  Chief Complaint  Patient presents with   Chest Pain    Derek Erickson is a 46 y.o. male.  Patient presented chief complaint of chest pain.  Started at 3 AM.  He has a history of gastritis and reflux and he thought that was a bit symptoms persisted for several hours and he became diaphoretic.  Denies any shortness of breath denies palpitations denies fevers or cough denies vomiting or diarrhea positive nausea however.  Family history of heart disease extensive in the patient's history, however no prior history of MI in the patient.  Denies any smoking history.       Home Medications Prior to Admission medications   Medication Sig Start Date End Date Taking? Authorizing Provider  ARIPiprazole (ABILIFY) 15 MG tablet Take 1 tablet by mouth daily. 11/23/20   [provider]  citalopram (CELEXA) 40 MG tablet Take 1 tablet by mouth daily. 11/16/20 11/17/21  [provider]  COVID-19 mRNA bivalent vaccine, Moderna, (MODERNA COVID-19 BIVAL BOOSTER) 50 MCG/0.5ML injection Inject into the muscle. 09/07/21   Judyann Munson, MD  hydrocortisone (ANUSOL-HC) 25 MG suppository Place 1 suppository (25 mg total) rectally 2 (two) times daily as needed for hemorrhoids. 09/18/21   Tollie Eth, NP  NEEDLE, DISP, 22 G 22G X 1-1/2" MISC For IM testosterone injection. Once every 14 days. 12/31/21   Early, Sung Amabile, NP  ondansetron (ZOFRAN) 8 MG tablet Take 1 tablet (8 mg total) by mouth every 8 (eight) hours as needed for nausea or vomiting. 09/14/21   Tollie Eth, NP  Rimegepant Sulfate (NURTEC) 75 MG TBDP Dissolve 1 tab in mouth at the first sign of migraine. May repeat in 2 hours if symptoms are not improved. Do not take more than 2 doses in 24 hours . 01/03/22   Tollie Eth, NP  Semaglutide-Weight Management 1.7 MG/0.75ML SOAJ Inject 1.7 mg into the skin once a week for 28  days. 04/21/22 05/19/22  Early, Sung Amabile, NP  SYRINGE-NEEDLE, DISP, 3 ML (B-D 3CC LUER-LOK SYR 22GX1") 22G X 1" 3 ML MISC Use to inject testosterone 12/31/21   Early, Sung Amabile, NP  tamsulosin (FLOMAX) 0.4 MG CAPS capsule Take 1 capsule (0.4 mg total) by mouth daily. 09/17/21   Tollie Eth, NP  testosterone cypionate (DEPOTESTOSTERONE CYPIONATE) 200 MG/ML injection Inject 0.5 mLs (100 mg total) into the muscle every 14 (fourteen) days. 01/01/22   Tollie Eth, NP  traZODone (DESYREL) 100 MG tablet Take 100 mg by mouth at bedtime. 11/23/20   [provider]  verapamil (CALAN) 40 MG tablet Take 1 tablet (40 mg total) by mouth 3 (three) times daily. 12/14/21   Tollie Eth, NP  Vitamin D, Ergocalciferol, (DRISDOL) 1.25 MG (50000 UNIT) CAPS capsule Take 1 capsule (50,000 Units total) by mouth every 7 (seven) days. Take for 12 total weeks. 12/18/21   Tollie Eth, NP      Allergies    Prazosin    Review of Systems   Review of Systems  Constitutional:  Negative for fever.  HENT:  Negative for ear pain and sore throat.   Eyes:  Negative for pain.  Respiratory:  Negative for cough.   Cardiovascular:  Positive for chest pain.  Gastrointestinal:  Negative for abdominal pain.  Genitourinary:  Negative for flank pain.  Musculoskeletal:  Negative for back pain.  Skin:  Negative for color change and rash.  Neurological:  Negative for syncope.  All other systems reviewed and are negative.   Physical Exam Updated Vital Signs BP 123/84   Pulse 88   Temp 98.2 F (36.8 C)   Resp 18   Ht 5\' 6"  (1.676 m)   Wt 93 kg   SpO2 94%   BMI 33.09 kg/m  Physical Exam Constitutional:      Appearance: He is well-developed.  HENT:     Head: Normocephalic.     Nose: Nose normal.  Eyes:     Extraocular Movements: Extraocular movements intact.  Cardiovascular:     Rate and Rhythm: Normal rate.  Pulmonary:     Effort: Pulmonary effort is normal.  Abdominal:     Tenderness: There is no abdominal  tenderness. There is no guarding or rebound.  Skin:    Coloration: Skin is not jaundiced.  Neurological:     Mental Status: He is alert. Mental status is at baseline.     ED Results / Procedures / Treatments   Labs (all labs ordered are listed, but only abnormal results are displayed) Labs Reviewed  CBC WITH DIFFERENTIAL/PLATELET - Abnormal; Notable for the following components:      Result Value   WBC 11.7 (*)    Neutro Abs 8.7 (*)    All other components within normal limits  LIPASE, BLOOD - Abnormal; Notable for the following components:   Lipase 67 (*)    All other components within normal limits  COMPREHENSIVE METABOLIC PANEL  TROPONIN I (HIGH SENSITIVITY)  TROPONIN I (HIGH SENSITIVITY)    EKG None  Radiology DG Chest Port 1 View  Result Date: 01/27/2022 CLINICAL DATA:  Mid chest pain, shortness of breath and nausea today. EXAM: PORTABLE CHEST 1 VIEW COMPARISON:  04/04/2021. FINDINGS: Cardiac silhouette is normal in size. Normal mediastinal and hilar contours. Clear lungs.  No pleural effusion or pneumothorax. Skeletal structures are grossly intact. IMPRESSION: No active disease. Electronically Signed   By: 06/04/2021 M.D.   On: 01/27/2022 13:10    Procedures Procedures    Medications Ordered in ED Medications  morphine (PF) 4 MG/ML injection 4 mg (4 mg Intravenous Given 01/27/22 1303)  ondansetron (ZOFRAN) injection 4 mg (4 mg Intravenous Given 01/27/22 1303)    ED Course/ Medical Decision Making/ A&P                           Medical Decision Making Amount and/or Complexity of Data Reviewed Labs: ordered. Radiology: ordered.  Risk Prescription drug management.   History from family at bedside.  Review of records shows order for testosterone deficiency January 01, 2022.  Cardiac monitoring showing sinus rhythm.  Diagnostic studies include x-rays which were unremarkable per radiologist.  Labs are sent troponins are negative.  White count normal lipase  mildly elevated.  Given findings today I doubt acute coronary syndrome.  Patient advised given his history to follow-up closely with cardiology this week.  Advised avoidance of strenuous activity.  Advised immediate return however back to the ER if he has worsening chest pain difficulty breathing or any additional concerns.   Final Clinical Impression(s) / ED Diagnoses Final diagnoses:  Chest pain, unspecified type    Rx / DC Orders ED Discharge Orders     None         January 03, 2022, MD 01/27/22 1505    03/30/22, MD 01/27/22 1517

## 2022-01-28 ENCOUNTER — Other Ambulatory Visit (HOSPITAL_BASED_OUTPATIENT_CLINIC_OR_DEPARTMENT_OTHER): Payer: Self-pay

## 2022-01-30 ENCOUNTER — Telehealth (HOSPITAL_BASED_OUTPATIENT_CLINIC_OR_DEPARTMENT_OTHER): Payer: Self-pay

## 2022-01-30 ENCOUNTER — Other Ambulatory Visit (HOSPITAL_BASED_OUTPATIENT_CLINIC_OR_DEPARTMENT_OTHER): Payer: Self-pay | Admitting: Nurse Practitioner

## 2022-01-30 DIAGNOSIS — R748 Abnormal levels of other serum enzymes: Secondary | ICD-10-CM

## 2022-01-30 DIAGNOSIS — D72820 Lymphocytosis (symptomatic): Secondary | ICD-10-CM

## 2022-01-30 NOTE — Telephone Encounter (Signed)
Received fax from [employer] Wegovy 1.7 was approved reference # [id number]

## 2022-01-31 ENCOUNTER — Other Ambulatory Visit (HOSPITAL_BASED_OUTPATIENT_CLINIC_OR_DEPARTMENT_OTHER): Payer: Self-pay

## 2022-01-31 LAB — CBC WITH DIFFERENTIAL/PLATELET
Basophils Absolute: 0.1 10*3/uL (ref 0.0–0.2)
Basos: 1 %
EOS (ABSOLUTE): 0.4 10*3/uL (ref 0.0–0.4)
Eos: 4 %
Hematocrit: 45.1 % (ref 37.5–51.0)
Hemoglobin: 15 g/dL (ref 13.0–17.7)
Immature Grans (Abs): 0 10*3/uL (ref 0.0–0.1)
Immature Granulocytes: 0 %
Lymphocytes Absolute: 2.7 10*3/uL (ref 0.7–3.1)
Lymphs: 27 %
MCH: 30.2 pg (ref 26.6–33.0)
MCHC: 33.3 g/dL (ref 31.5–35.7)
MCV: 91 fL (ref 79–97)
Monocytes Absolute: 0.8 10*3/uL (ref 0.1–0.9)
Monocytes: 8 %
Neutrophils Absolute: 6 10*3/uL (ref 1.4–7.0)
Neutrophils: 60 %
Platelets: 395 10*3/uL (ref 150–450)
RBC: 4.97 x10E6/uL (ref 4.14–5.80)
RDW: 13.2 % (ref 11.6–15.4)
WBC: 10 10*3/uL (ref 3.4–10.8)

## 2022-01-31 LAB — AMYLASE: Amylase: 69 U/L (ref 31–110)

## 2022-01-31 LAB — LIPASE: Lipase: 90 U/L — ABNORMAL HIGH (ref 13–78)

## 2022-02-01 ENCOUNTER — Encounter (HOSPITAL_BASED_OUTPATIENT_CLINIC_OR_DEPARTMENT_OTHER): Payer: Self-pay | Admitting: Family

## 2022-02-01 ENCOUNTER — Ambulatory Visit (INDEPENDENT_AMBULATORY_CARE_PROVIDER_SITE_OTHER): Payer: 59 | Admitting: Family

## 2022-02-01 ENCOUNTER — Other Ambulatory Visit (HOSPITAL_BASED_OUTPATIENT_CLINIC_OR_DEPARTMENT_OTHER): Payer: Self-pay

## 2022-02-01 ENCOUNTER — Other Ambulatory Visit (HOSPITAL_BASED_OUTPATIENT_CLINIC_OR_DEPARTMENT_OTHER): Payer: Self-pay | Admitting: Nurse Practitioner

## 2022-02-01 VITALS — BP 112/74 | HR 87 | Ht 66.0 in | Wt 213.0 lb

## 2022-02-01 DIAGNOSIS — R0683 Snoring: Secondary | ICD-10-CM | POA: Diagnosis not present

## 2022-02-01 DIAGNOSIS — G4730 Sleep apnea, unspecified: Secondary | ICD-10-CM

## 2022-02-01 DIAGNOSIS — R072 Precordial pain: Secondary | ICD-10-CM

## 2022-02-01 DIAGNOSIS — E782 Mixed hyperlipidemia: Secondary | ICD-10-CM | POA: Diagnosis not present

## 2022-02-01 DIAGNOSIS — K219 Gastro-esophageal reflux disease without esophagitis: Secondary | ICD-10-CM

## 2022-02-01 DIAGNOSIS — R079 Chest pain, unspecified: Secondary | ICD-10-CM | POA: Diagnosis not present

## 2022-02-01 MED ORDER — METOPROLOL TARTRATE 100 MG PO TABS
ORAL_TABLET | ORAL | 0 refills | Status: DC
  Filled 2022-02-01: qty 1, 1d supply, fill #0

## 2022-02-01 MED ORDER — PANTOPRAZOLE SODIUM 20 MG PO TBEC
DELAYED_RELEASE_TABLET | ORAL | 2 refills | Status: DC
  Filled 2022-02-01: qty 30, 30d supply, fill #0

## 2022-02-01 MED ORDER — SEMAGLUTIDE-WEIGHT MANAGEMENT 1.7 MG/0.75ML ~~LOC~~ SOAJ
1.7000 mg | SUBCUTANEOUS | 2 refills | Status: DC
  Filled 2022-02-01: qty 9, fill #0
  Filled 2022-03-05: qty 3, 28d supply, fill #0
  Filled 2022-04-29: qty 3, 28d supply, fill #1
  Filled 2022-06-03: qty 3, 28d supply, fill #2
  Filled 2022-07-10: qty 3, 28d supply, fill #3
  Filled 2022-08-07: qty 3, 28d supply, fill #4
  Filled 2022-09-18: qty 3, 28d supply, fill #5

## 2022-02-01 NOTE — Progress Notes (Unsigned)
Cardiology Clinic Note   Patient Name: Derek Erickson Date of Encounter: 02/01/2022  Primary Care Provider:  Orma Render, NP Primary Cardiologist:  None Electrophysiologist:  None   Chief complaint    Derek Erickson is a 46 y.o. male presents today for follow up after ED visit for chest pain.   Past Medical History    Past Medical History:  Diagnosis Date   Anxiety    Depression    Rash of finger 07/10/2021   Past Surgical History:  Procedure Laterality Date   TONSILLECTOMY      Allergies  Allergies  Allergen Reactions   Prazosin Other (See Comments)    LOC and Dizziness     History of Present Illness   Derek Erickson is a 46 y.o. male with a hx of migraine, obesity, hyperlipidemia, depression, anxiety.  Family history notable for heart disease.  ED visit 01/27/2022 with chest pain.  Persistent chest pain for several hours and became diaphoretic. HS troponin negativex2, telemetry with NSR, CXR no acute ST/T wave changes.  He presents today to establish with cardiology. ***The episode happened while he was asleep   He has had a total of 3 episodes of chest discomfort. Also felt nauseous and sweaty.   Dad had heart disease with 5 heart attacks.   Interested in cutting back.  Exercising at the gym 3 days per week.   Eats at home and out. Does not add salt to his.   Notes no edema, orthopnea, PND. Some exertional dyspnea which he attributes to being out of shape.    EKGs/Labs/Other Studies Reviewed:   The following studies were reviewed today:  Recent Labs: 09/11/2021: TSH 1.550 01/27/2022: ALT 26; BUN 12; Creatinine, Ser 0.86; Potassium 4.1; Sodium 135 01/30/2022: Hemoglobin 15.0; Platelets 395  Recent Lipid Panel    Component Value Date/Time   CHOL 392 (H) 11/27/2020 1451   TRIG 502 (H) 11/27/2020 1451   HDL 50 11/27/2020 1451   CHOLHDL 7.8 11/27/2020 1451   VLDL UNABLE TO CALCULATE IF TRIGLYCERIDE OVER 400 mg/dL 11/27/2020 1451   LDLCALC UNABLE TO CALCULATE  IF TRIGLYCERIDE OVER 400 mg/dL 11/27/2020 1451   LDLDIRECT 272.4 (H) 11/27/2020 1451    Home Medications      Current Meds  Medication Sig   ARIPiprazole (ABILIFY) 15 MG tablet Take 1 tablet by mouth daily.   hydrocortisone (ANUSOL-HC) 25 MG suppository Place 1 suppository (25 mg total) rectally 2 (two) times daily as needed for hemorrhoids.   NEEDLE, DISP, 22 G 22G X 1-1/2" MISC For IM testosterone injection. Once every 14 days.   ondansetron (ZOFRAN) 8 MG tablet Take 1 tablet (8 mg total) by mouth every 8 (eight) hours as needed for nausea or vomiting.   Rimegepant Sulfate (NURTEC) 75 MG TBDP Dissolve 1 tab in mouth at the first sign of migraine. May repeat in 2 hours if symptoms are not improved. Do not take more than 2 doses in 24 hours .   [START ON 04/21/2022] Semaglutide-Weight Management 1.7 MG/0.75ML SOAJ Inject 1.7 mg into the skin once a week for 28 days.   SYRINGE-NEEDLE, DISP, 3 ML (B-D 3CC LUER-LOK SYR 22GX1") 22G X 1" 3 ML MISC Use to inject testosterone   tamsulosin (FLOMAX) 0.4 MG CAPS capsule Take 1 capsule (0.4 mg total) by mouth daily.   testosterone cypionate (DEPOTESTOSTERONE CYPIONATE) 200 MG/ML injection Inject 0.5 mLs (100 mg total) into the muscle every 14 (fourteen) days.   traZODone (DESYREL) 100 MG tablet Take 100  mg by mouth at bedtime.   verapamil (CALAN) 40 MG tablet Take 1 tablet (40 mg total) by mouth 3 (three) times daily.   Vitamin D, Ergocalciferol, (DRISDOL) 1.25 MG (50000 UNIT) CAPS capsule Take 1 capsule (50,000 Units total) by mouth every 7 (seven) days. Take for 12 total weeks.     Family History    Family History  Problem Relation Age of Onset   Diabetes Mother    Heart attack Father    Heart disease Father    Dementia Maternal Grandmother    Cancer Maternal Grandfather    He indicated that his mother is alive. He indicated that his father is deceased. He indicated that his maternal grandmother is deceased. He indicated that his maternal  grandfather is deceased. He indicated that his paternal grandmother is deceased. He indicated that his paternal grandfather is deceased.  Social History    Social History   Socioeconomic History   Marital status: Married    Spouse name: Not on file   Number of children: Not on file   Years of education: Not on file   Highest education level: Not on file  Occupational History   Not on file  Tobacco Use   Smoking status: Never   Smokeless tobacco: Current    Types: Chew  Vaping Use   Vaping Use: Never used  Substance and Sexual Activity   Alcohol use: Yes   Drug use: Never   Sexual activity: Yes    Birth control/protection: None  Other Topics Concern   Not on file  Social History Narrative   Not on file   Social Determinants of Health   Financial Resource Strain: Not on file  Food Insecurity: Not on file  Transportation Needs: Not on file  Physical Activity: Not on file  Stress: Not on file  Social Connections: Not on file  Intimate Partner Violence: Not on file     Review of Systems    ROS  Physical Exam    VS:  BP 112/74   Pulse 87   Ht 5\' 6"  (1.676 m)   Wt 213 lb (96.6 kg)   BMI 34.38 kg/m  , BMI Body mass index is 34.38 kg/m. GEN: Well nourished, well developed, in no acute distress. HEENT: Normal. Neck: Supple, no JVD, carotid bruits, or masses. Cardiac: ***RRR, no murmurs, rubs, or gallops. No clubbing, cyanosis, edema.  ***Radials/DP/PT 2+ and equal bilaterally.  Respiratory:  ***Respirations regular and unlabored, clear to auscultation bilaterally. GI: Soft, nontender, nondistended, BS + x 4. MS: No deformity or atrophy. Skin: Warm and dry, no rash. Neuro:  Strength and sensation are intact. Psych: Normal affect.  Accessory Clinical Findings    ECG personally reviewed by me today-KEG independently reviewed from 01/27/22 - NSR 94 bpm with rightward axis and no acute ST/T wave changes.  Assessment & Plan   Chest pain in adult -   GERD - Notes  frequent indigestion symptoms. Educated to limit fried, spicy, acidic foods and sit upright at least 30 minutes after eating. Rx Protonix 20mg  QD x 2 weeks then PRN.   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 Last lipid panel >1 year ago. Lipid panel, direct LDL, lipoprotein a ordered. Pending lab results, anticipate starting Rosuvastatin.   Snores / Sleep disordered breathing - STOP Bang score of 4. Notes snoring and waking feeling tired. [employer] WatchPat home sleep study provided in clinic today.   BMI 34 - Weight loss via diet and exercise encouraged. Discussed  the impact being overweight would have on cardiovascular risk. Has been working on weight loss diligently with exercise, dietary changes, Wegovy. Congratulated on weight loss. Could consider referral to PREP exercise program at [facility] at follow up if he is interested.   Migraine - On Verapamil per PCP for prevention.   Disposition: in 1 month(s) with Alver Sorrow, NP .  Alver Sorrow, NP  02/01/2022, 11:14 AM

## 2022-02-01 NOTE — Patient Instructions (Addendum)
Medication Instructions:  Your physician has recommended you make the following change in your medication:   Start: Protonix 20mg  daily for two weeks- if things get better then you can stop if they don't send a message and we can refill   *If you need a refill on your cardiac medications before your next appointment, please call your pharmacy*   Lab Work: Your physician recommends that you return for lab work within the next week- Lipoprotein A, Lipid panel, direct LDL  If you have labs (blood work) drawn today and your tests are completely normal, you will receive your results only by: MyChart Message (if you have MyChart) OR A paper copy in the mail If you have any lab test that is abnormal or we need to change your treatment, we will call you to review the results.   Testing/Procedures: WatchPAT?  Is a FDA cleared portable home sleep study test that uses a watch and 3 points of contact to monitor 7 different channels, including your heart rate, oxygen saturations, body position, snoring, and chest motion.  The study is easy to use from the comfort of your own home and accurately detect sleep apnea.  Before bed, you attach the chest sensor, attached the sleep apnea bracelet to your nondominant hand, and attach the finger probe.  After the study, the raw data is downloaded from the watch and scored for apnea events.   For more information: [email or web address]  Patient Testing Instructions:  Do not put battery into the device until bedtime when you are ready to begin the test. Please call the support number if you need assistance after following the instructions below: 24 hour support line- [phone] or [employer]AM[employer] support at [phone] (option 2)  Download the [phone] One" app through the google play store or App Store  Be sure to turn on or enable access to bluetooth in settlings on your smartphone/ device  Make sure no other bluetooth devices are on and  within the vicinity of your smartphone/ device and WatchPAT watch during testing.  Make sure to leave your smart phone/ device plugged in and charging all night.  When ready for bed:  Follow the instructions step by step in the WatchPAT One App to activate the testing device. For additional instructions, including video instruction, visit the WatchPAT One video on Youtube. You can search for WatchPat One within Youtube (video is 4 minutes and 18 seconds) or enter: https://youtube/[email or web address]?[email or web address]_[email or web address] Please note: You will be prompted to enter a Pin to connect via bluetooth when starting the test. The PIN will be assigned to you when you receive the test.  The device is disposable, but it recommended that you retain the device until you receive a call letting you know the study has been received and the results have been interpreted.  We will let you know if the study did not transmit to [employer] properly after the test is completed. You do not need to call us to confirm the receipt of the test.  Please complete the test within 48 hours of receiving PIN.   Frequently Asked Questions:  What is Watch [place] one?  A single use fully disposable home sleep apnea testing device and will not need to be returned after completion.  What are the requirements to use WatchPAT one?  The be able to have a successful watchpat one sleep study, you should have your Watch pat one device, your smart phone, watch pat one app, your PIN number and Internet access What  type of phone do I need?  You should have a smart phone that uses Android 5.1 and above or any Iphone with IOS 10 and above How can I download the WatchPAT one app?  Based on your device type search for WatchPAT one app either in google play for android devices or APP store for Iphone's Where will I get my PIN for the study?  Your PIN will be provided by your physician's office. It is used for authentication and if you lose/forget your PIN, please reach out to your  providers office.  I do not have Internet at home. Can I do WatchPAT one study?  WatchPAT One needs Internet connection throughout the night to be able to transmit the sleep data. You can use your home/local internet or your cellular's data package. However, it is always recommended to use home/local Internet. It is estimated that between 20MB-30MB will be used with each study.However, the application will be looking for 80MB space in the phone to start the study.  What happens if I lose internet or bluetooth connection?  During the internet disconnection, your phone will not be able to transmit the sleep data. All the data, will be stored in your phone. As soon as the internet connection is back on, the phone will being sending the sleep data. During the bluetooth disconnection, WatchPAT one will not be able to to send the sleep data to your phone. Data will be kept in the [facility] one until two devices have bluetooth connection back on. As soon as the connection is back on, WatchPAT one will send the sleep data to the phone.  How long do I need to wear the WatchPAT one?  After you start the study, you should wear the device at least 6 hours.  How far should I keep my phone from the device?  During the night, your phone should be within 15 feet.  What happens if I leave the room for restroom or other reasons?  Leaving the room for any reason will not cause any problem. As soon as your get back to the room, both devices will reconnect and will continue to send the sleep data. Can I use my phone during the sleep study?  Yes, you can use your phone as usual during the study. But it is recommended to put your watchpat one on when you are ready to go to bed.  How will I get my study results?  A soon as you completed your study, your sleep data will be sent to the provider. They will then share the results with you when they are ready.     Follow-Up: At [facility], you and your health needs are  our priority.  As part of our continuing mission to provide you with exceptional heart care, we have created designated Provider Care Teams.  These Care Teams include your primary Cardiologist (physician) and Advanced Practice Providers (APPs -  Physician Assistants and Nurse Practitioners) who all work together to provide you with the care you need, when you need it.  We recommend signing up for the patient portal called "MyChart".  Sign up information is provided on this After Visit Summary.  MyChart is used to connect with patients for Virtual Visits (Telemedicine).  Patients are able to view lab/test results, encounter notes, upcoming appointments, etc.  Non-urgent messages can be sent to your provider as well.   To learn more about what you can do with MyChart, go to [email or web address].  Your next appointment:   1 month(s)  The format for your next appointment:   In Person  Provider:   Gillian Shields, [facility]  Other Instructions   Your cardiac CT will be scheduled at one of the below locations:   [facility] [address] [place], [place] [address] [phone]  If scheduled at [facility], please arrive at the [facility] and Children's Entrance (Entrance C2) of [facility] 30 minutes prior to test start time. You can use the FREE valet parking offered at entrance C (encouraged to control the heart rate for the test)  Proceed to the [facility] Radiology Department (first floor) to check-in and test prep.  All radiology patients and guests should use entrance C2 at [facility], accessed from [facility] even though the hospital's physical address listed is [address]     Please follow these instructions carefully (unless otherwise directed):  On the Night Before the Test: Be sure to Drink plenty of water. Do not consume any caffeinated/decaffeinated beverages or chocolate 12 hours prior to your test. Do not  take any antihistamines 12 hours prior to your test.  On the Day of the Test: Drink plenty of water until 1 hour prior to the test. Do not eat any food 4 hours prior to the test. You may take your regular medications prior to the test.  Take metoprolol (Lopressor) two hours prior to test.      After the Test: Drink plenty of water. After receiving IV contrast, you may experience a mild flushed feeling. This is normal. On occasion, you may experience a mild rash up to 24 hours after the test. This is not dangerous. If this occurs, you can take Benadryl 25 mg and increase your fluid intake. If you experience trouble breathing, this can be serious. If it is severe call 911 IMMEDIATELY. If it is mild, please call our office. If you take any of these medications: Glipizide/Metformin, Avandament, Glucavance, please do not take 48 hours after completing test unless otherwise instructed.  We will call to schedule your test 2-4 weeks out understanding that some insurance companies will need an authorization prior to the service being performed.   For non-scheduling related questions, please contact the cardiac imaging nurse navigator should you have any questions/concerns: Rockwell Alexandria, Cardiac Imaging Nurse Navigator Larey Brick, Cardiac Imaging Nurse Navigator [facility] Direct Office Dial: [phone]   For scheduling needs, including cancellations and rescheduling, please call [place], [phone].

## 2022-02-02 ENCOUNTER — Encounter (HOSPITAL_BASED_OUTPATIENT_CLINIC_OR_DEPARTMENT_OTHER): Payer: Self-pay | Admitting: Family

## 2022-02-08 ENCOUNTER — Encounter (HOSPITAL_BASED_OUTPATIENT_CLINIC_OR_DEPARTMENT_OTHER): Payer: Self-pay

## 2022-02-08 ENCOUNTER — Ambulatory Visit (INDEPENDENT_AMBULATORY_CARE_PROVIDER_SITE_OTHER): Payer: 59 | Admitting: Nurse Practitioner

## 2022-02-08 ENCOUNTER — Encounter (HOSPITAL_BASED_OUTPATIENT_CLINIC_OR_DEPARTMENT_OTHER): Payer: Self-pay | Admitting: Nurse Practitioner

## 2022-02-08 ENCOUNTER — Other Ambulatory Visit (HOSPITAL_BASED_OUTPATIENT_CLINIC_OR_DEPARTMENT_OTHER): Payer: Self-pay

## 2022-02-08 VITALS — BP 110/78 | Ht 66.0 in | Wt 216.8 lb

## 2022-02-08 DIAGNOSIS — Z6835 Body mass index (BMI) 35.0-35.9, adult: Secondary | ICD-10-CM

## 2022-02-08 DIAGNOSIS — F4312 Post-traumatic stress disorder, chronic: Secondary | ICD-10-CM

## 2022-02-08 DIAGNOSIS — F411 Generalized anxiety disorder: Secondary | ICD-10-CM | POA: Diagnosis not present

## 2022-02-08 DIAGNOSIS — E781 Pure hyperglyceridemia: Secondary | ICD-10-CM

## 2022-02-08 DIAGNOSIS — K219 Gastro-esophageal reflux disease without esophagitis: Secondary | ICD-10-CM | POA: Diagnosis not present

## 2022-02-08 DIAGNOSIS — E785 Hyperlipidemia, unspecified: Secondary | ICD-10-CM

## 2022-02-08 DIAGNOSIS — F3340 Major depressive disorder, recurrent, in remission, unspecified: Secondary | ICD-10-CM | POA: Diagnosis not present

## 2022-02-08 DIAGNOSIS — E291 Testicular hypofunction: Secondary | ICD-10-CM | POA: Diagnosis not present

## 2022-02-08 MED ORDER — TRAZODONE HCL 100 MG PO TABS
100.0000 mg | ORAL_TABLET | Freq: Every day | ORAL | 3 refills | Status: DC
  Filled 2022-02-08: qty 90, 90d supply, fill #0
  Filled 2022-05-06: qty 90, 90d supply, fill #1
  Filled 2022-08-07: qty 90, 90d supply, fill #2

## 2022-02-08 MED ORDER — CITALOPRAM HYDROBROMIDE 40 MG PO TABS
40.0000 mg | ORAL_TABLET | Freq: Every day | ORAL | 3 refills | Status: DC
  Filled 2022-02-08: qty 90, 90d supply, fill #0
  Filled 2022-05-21: qty 90, 90d supply, fill #1
  Filled 2022-08-15: qty 90, 90d supply, fill #2

## 2022-02-08 MED ORDER — TESTOSTERONE CYPIONATE 200 MG/ML IM SOLN
200.0000 mg | INTRAMUSCULAR | 5 refills | Status: DC
  Filled 2022-02-08 – 2022-02-15 (×2): qty 2, 28d supply, fill #0

## 2022-02-08 MED ORDER — ONDANSETRON HCL 8 MG PO TABS
8.0000 mg | ORAL_TABLET | Freq: Three times a day (TID) | ORAL | 5 refills | Status: DC | PRN
  Filled 2022-02-08: qty 30, 10d supply, fill #0
  Filled 2022-06-14: qty 30, 10d supply, fill #1
  Filled 2022-08-26: qty 30, 10d supply, fill #2

## 2022-02-08 MED ORDER — ARIPIPRAZOLE 15 MG PO TABS
15.0000 mg | ORAL_TABLET | Freq: Every day | ORAL | 3 refills | Status: DC
  Filled 2022-02-08: qty 90, 90d supply, fill #0
  Filled 2022-05-21: qty 90, 90d supply, fill #1
  Filled 2022-08-15: qty 90, 90d supply, fill #2

## 2022-02-08 NOTE — Progress Notes (Signed)
[name], DNP, AGNP-c [facility] [address] [place], [place] [address] [phone] Office [phone] Fax  ESTABLISHED PATIENT- Chronic Health and/or Follow-Up Visit  Blood pressure 110/78, height 5\' 6"  (1.676 m), weight 216 lb 12.8 oz (98.3 kg).  Follow-up (Pt here for f/u)   HPI  Derek Erickson  is a 46 y.o. year old male presenting today for evaluation and management of the following: Recent ED Visit for chest pain At the time of visit visit he awoke with pain at approximately 3 AM.  He tells me that the pain was not relieved after several hours and he began to feel sweaty and nauseous and that prompted him to go to the emergency room. Examination, imaging, and labs within normal limits in the emergency room and he was discharged home with no new medications and referral to cardiology. He has seen cardiology for follow-up evaluation and is scheduled for a cardiac calcium CT on 31 July. He has had a recent lipid Panel He has not had any repeat episodes of chest pain He is not having any shortness of breath, dizziness, lower extremity edema, headaches. He tells me that he feels that the chest pain was a result of reflux. He would like for me to take over his medications from the [facility]. Doing well on medications  ROS All ROS negative with exception of what is listed in HPI  PHYSICAL EXAM Physical Exam Vitals and nursing note reviewed.  Constitutional:      General: He is not in acute distress.    Appearance: Normal appearance. He is not ill-appearing.  HENT:     Head: Normocephalic.     Mouth/Throat:     Mouth: Mucous membranes are moist.     Pharynx: Oropharynx is clear.  Eyes:     Pupils: Pupils are equal, round, and reactive to light.  Neck:     Vascular: No carotid bruit.  Cardiovascular:     Rate and Rhythm: Normal rate and regular rhythm.     Pulses: Normal pulses.     Heart sounds: Normal heart  sounds. No murmur heard. Pulmonary:     Effort: Pulmonary effort is normal.     Breath sounds: Normal breath sounds. No wheezing or rhonchi.  Abdominal:     General: Bowel sounds are normal. There is no distension.     Palpations: Abdomen is soft.     Tenderness: There is abdominal tenderness. There is no guarding.     Comments: Mild epigastric tenderness on palpation noted.  Musculoskeletal:        General: Normal range of motion.     Cervical back: Normal range of motion.     Right lower leg: No edema.     Left lower leg: No edema.  Lymphadenopathy:     Cervical: No cervical adenopathy.  Skin:    General: Skin is warm and dry.     Capillary Refill: Capillary refill takes less than 2 seconds.  Neurological:     General: No focal deficit present.     Mental Status: He is alert and oriented to person, place, and time.     Motor: No weakness.  Psychiatric:        Mood and Affect: Mood normal.        Behavior: Behavior normal.        Thought Content: Thought content normal.        Judgment: Judgment normal.     ASSESSMENT &  PLAN Problem List Items Addressed This Visit     Hypertriglyceridemia    Chronic finding on previous labs.  He has been working on [employer] and improved control of his blood sugars as well as diet.  We will recheck lipid panel today for monitoring.      Relevant Medications   ondansetron (ZOFRAN) 8 MG tablet   BMI 35.0-35.9,adult    Currently working on diet and exercise as well as weight management with semaglutide.  He is tolerating this well without any concerning side effects or symptoms.  Recommendation for continuation of dietary monitoring and management.  We will make changes as necessary.  Will obtain labs today.      Relevant Medications   ondansetron (ZOFRAN) 8 MG tablet   Post-traumatic stress disorder, chronic    Chronic.  Currently very well managed on medication previously provided through the [place].  I will take over the medication  management for him at this time.  He does not have any alarm symptoms present today.  We will plan to continue medications and follow-up routinely.      Relevant Medications   ARIPiprazole (ABILIFY) 15 MG tablet   citalopram (CELEXA) 40 MG tablet   traZODone (DESYREL) 100 MG tablet   Generalized anxiety disorder    Chronic.  Well-controlled on current regimen.  He has previously received his medications to the [facility] however I will take over medication management at this time.  We will follow-up routinely for monitoring.  No alarm symptoms present at this time.  Continue current medications and doses.      Relevant Medications   ARIPiprazole (ABILIFY) 15 MG tablet   citalopram (CELEXA) 40 MG tablet   traZODone (DESYREL) 100 MG tablet   Recurrent major depressive disorder, in remission (HCC) - Primary    Chronic.  Previously managed with the [facility].  I we will take over medication management for him at this time.  He does not have any alarm symptoms present today.  Recommend continuation of Abilify 15 mg daily, Celexa 40 mg daily, and trazodone 100 mg at bedtime.  If he begins to experience any new or worsening emotional symptoms he will follow-up immediately.      Relevant Medications   ARIPiprazole (ABILIFY) 15 MG tablet   citalopram (CELEXA) 40 MG tablet   traZODone (DESYREL) 100 MG tablet   Testosterone deficiency in male    Recent diagnosis of testosterone deficiency.  He is currently managed on replacement therapy.  We will repeat labs today for further evaluation.  He is responding well to medication without any known side effects.      Relevant Orders   Testosterone, Free, Total, SHBG   Gastroesophageal reflux disease    Recent episode of ongoing chest pain with no alarm symptoms or testing found in the emergency room.  Symptoms appear to be consistent with gastroesophageal reflux disease.  Patient has been started on a PPI and is tolerating well.  Will send refill of  ondansetron for patient to have in the event that he does begin to experience nausea.  No alarm symptoms present at this time.  We will plan to follow-up as needed.      Relevant Medications   ondansetron (ZOFRAN) 8 MG tablet   Dyslipidemia   Relevant Medications   ondansetron (ZOFRAN) 8 MG tablet     FOLLOW-UP Return if symptoms worsen or fail to improve.  [name], DNP, AGNP-c

## 2022-02-08 NOTE — Patient Instructions (Addendum)
Derek Erickson,   I have sent in your medications from the [place]. Just let me know when you need refills on these.   You can take the testosterone as 0.16mL (100mg ) once a week or 29mL (200mg ) every other week- it is your choice. We will plan to check your testosterone levels in about 2 weeks to see how this dose is doing. You don't need to see me for this.   If you have any further chest pain, please seek care immediately to be checked out.   If the protonix is working for you I will be happy to continue this for you- just let me know.

## 2022-02-11 ENCOUNTER — Other Ambulatory Visit (HOSPITAL_BASED_OUTPATIENT_CLINIC_OR_DEPARTMENT_OTHER): Payer: Self-pay

## 2022-02-15 ENCOUNTER — Other Ambulatory Visit (HOSPITAL_BASED_OUTPATIENT_CLINIC_OR_DEPARTMENT_OTHER): Payer: Self-pay

## 2022-02-19 ENCOUNTER — Telehealth: Payer: Self-pay | Admitting: *Deleted

## 2022-02-19 NOTE — Telephone Encounter (Signed)
Call to [facility] no prior auth required, benefit confirmed.  Ref # [id number]

## 2022-02-21 ENCOUNTER — Encounter (INDEPENDENT_AMBULATORY_CARE_PROVIDER_SITE_OTHER): Payer: 59 | Admitting: Cardiology

## 2022-02-21 DIAGNOSIS — G4733 Obstructive sleep apnea (adult) (pediatric): Secondary | ICD-10-CM | POA: Diagnosis not present

## 2022-02-22 ENCOUNTER — Other Ambulatory Visit (HOSPITAL_BASED_OUTPATIENT_CLINIC_OR_DEPARTMENT_OTHER): Payer: Self-pay | Admitting: Nurse Practitioner

## 2022-02-22 ENCOUNTER — Ambulatory Visit: Payer: 59

## 2022-02-22 ENCOUNTER — Telehealth (HOSPITAL_COMMUNITY): Payer: Self-pay | Admitting: *Deleted

## 2022-02-22 DIAGNOSIS — E291 Testicular hypofunction: Secondary | ICD-10-CM | POA: Diagnosis not present

## 2022-02-22 DIAGNOSIS — G4730 Sleep apnea, unspecified: Secondary | ICD-10-CM

## 2022-02-22 DIAGNOSIS — E782 Mixed hyperlipidemia: Secondary | ICD-10-CM | POA: Diagnosis not present

## 2022-02-22 DIAGNOSIS — R0683 Snoring: Secondary | ICD-10-CM

## 2022-02-22 DIAGNOSIS — R079 Chest pain, unspecified: Secondary | ICD-10-CM | POA: Diagnosis not present

## 2022-02-22 NOTE — Procedures (Signed)
   SLEEP STUDY REPORT Patient Information Study Date: 02/21/22 Patient Name: Derek Erickson Patient ID: [id number] Birth Date: [birth year] Age: 46 Gender: Male BMI: 34.4 (W=214 lb, H=5' 6'') Referring Physician: Gillian Shields, NP  TEST DESCRIPTION: Home sleep apnea testing was completed using the WatchPat, a Type 1 device, utilizing  peripheral arterial tonometry (PAT), chest movement, actigraphy, pulse oximetry, pulse rate, body position and snore.  AHI was calculated with apnea and hypopnea using valid sleep time as the denominator. RDI includes apneas,  hypopneas, and RERAs. The data acquired and the scoring of sleep and all associated events were performed in  accordance with the recommended standards and specifications as outlined in the AASM Manual for the Scoring of  Sleep and Associated Events 2.2.0 (2015).   FINDINGS:  1. Mild Obstructive Sleep Apnea with AHI 13.7/hr.  2. No Central Sleep Apnea with pAHIc 0.2 /hr.  3. Oxygen desaturations as low as 84%.  4. Moderate to severe snoring was present. O2 sats were < 88% for 0.8 min.  5. Total sleep time was 6 hrs and 55 min.  6. 20.9% of total sleep time was spent in REM sleep.  7. Normal Sleep onet latency at 22 min. 8. Shortened REM sleep onset latency at 45 min.  9. Total awakenings were 17.   DIAGNOSIS: Mild Obstructive Sleep Apnea (G47.33)  RECOMMENDATIONS: 1. Clinical correlation of these findings is necessary. The decision to treat obstructive sleep apnea (OSA) is usually  based on the presence of apnea symptoms or the presence of associated medical conditions such as Hypertension,  Congestive Heart Failure, Atrial Fibrillation or Obesity. The most common symptoms of OSA are snoring, gasping for  breath while sleeping, daytime sleepiness and fatigue.   2. Initiating apnea therapy is recommended given the presence of symptoms and/or associated conditions.  Recommend proceeding with one of the following:   a. Auto-CPAP  therapy with a pressure range of 5-20cm H2O.   b. An oral appliance (OA) that can be obtained from certain dentists with expertise in sleep medicine. These are  primarily of use in non-obese patients with mild and moderate disease.   c. An ENT consultation which may be useful to look for specific causes of obstruction and possible treatment  options.   d. If patient is intolerant to PAP therapy, consider referral to ENT for evaluation for hypoglossal nerve stimulator.   3. Close follow-up is necessary to ensure success with CPAP or oral appliance therapy for maximum benefit .  4. A follow-up oximetry study on CPAP is recommended to assess the adequacy of therapy and determine the need  for supplemental oxygen or the potential need for Bi-level therapy. An arterial blood gas to determine the adequacy of  baseline ventilation and oxygenation should also be considered.  5. Healthy sleep recommendations include: adequate nightly sleep (normal 7-9 hrs/night), avoidance of caffeine after  noon and alcohol near bedtime, and maintaining a sleep environment that is cool, dark and quiet.  6. Weight loss for overweight patients is recommended. Even modest amounts of weight loss can significantly  improve the severity of sleep apnea.  7. Snoring recommendations include: weight loss where appropriate, side sleeping, and avoidance of alcohol before  bed.  8. Operation of motor vehicle should be avoided when sleepy.  Signature: Electronically Signed: 02/22/22 Armanda Magic, MD; [facility]; Diplomat, American Board of  Sleep Medicine

## 2022-02-22 NOTE — Telephone Encounter (Signed)
Reaching out to patient to offer assistance regarding upcoming cardiac imaging study; pt verbalizes understanding of appt date/time, parking situation and where to check in, pre-test NPO status and medications ordered, and verified current allergies; name and call back number provided for further questions should they arise ? ?Glendale Wherry RN Navigator Cardiac Imaging ?[facility] ?[phone] office ?[phone] cell ? ?Patient to take 100mg metoprolol tartrate two hours prior to his cardiac CT scan. He is aware to arrive at 11:30am. ?

## 2022-02-25 ENCOUNTER — Ambulatory Visit (HOSPITAL_COMMUNITY)
Admission: RE | Admit: 2022-02-25 | Discharge: 2022-02-25 | Disposition: A | Discharge status: Home / Self Care | Payer: 59 | Source: Ambulatory Visit | Attending: Family | Admitting: Family

## 2022-02-25 DIAGNOSIS — R072 Precordial pain: Secondary | ICD-10-CM | POA: Insufficient documentation

## 2022-02-25 LAB — LIPOPROTEIN A (LPA): Lipoprotein (a): 13.1 nmol/L (ref <75.0)

## 2022-02-25 LAB — LIPID PANEL
Chol/HDL Ratio: 6.4 ratio — ABNORMAL HIGH (ref 0.0–5.0)
Cholesterol, Total: 237 mg/dL — ABNORMAL HIGH (ref 100–199)
HDL: 37 mg/dL — ABNORMAL LOW (ref >39)
LDL Chol Calc (NIH): 149 mg/dL — ABNORMAL HIGH (ref 0–99)
Triglycerides: 279 mg/dL — ABNORMAL HIGH (ref 0–149)
VLDL Cholesterol Cal: 51 mg/dL — ABNORMAL HIGH (ref 5–40)

## 2022-02-25 LAB — LDL CHOLESTEROL, DIRECT: LDL Direct: 159 mg/dL — ABNORMAL HIGH (ref 0–99)

## 2022-02-25 MED ORDER — DILTIAZEM HCL 25 MG/5ML IV SOLN
INTRAVENOUS | Status: AC
  Filled 2022-02-25: qty 5

## 2022-02-25 MED ORDER — NITROGLYCERIN 0.4 MG SL SUBL
0.8000 mg | SUBLINGUAL_TABLET | Freq: Once | SUBLINGUAL | Status: AC
  Administered 2022-02-25: 0.8 mg via SUBLINGUAL

## 2022-02-25 MED ORDER — NITROGLYCERIN 0.4 MG SL SUBL
SUBLINGUAL_TABLET | SUBLINGUAL | Status: AC
  Filled 2022-02-25: qty 2

## 2022-02-25 MED ORDER — METOPROLOL TARTRATE 5 MG/5ML IV SOLN
INTRAVENOUS | Status: AC
  Filled 2022-02-25: qty 10

## 2022-02-25 MED ORDER — METOPROLOL TARTRATE 5 MG/5ML IV SOLN
INTRAVENOUS | Status: AC
  Administered 2022-02-25: 5 mg via INTRAVENOUS
  Filled 2022-02-25: qty 5

## 2022-02-25 MED ORDER — METOPROLOL TARTRATE 5 MG/5ML IV SOLN
5.0000 mg | INTRAVENOUS | Status: DC | PRN
  Administered 2022-02-25: 5 mg via INTRAVENOUS

## 2022-02-25 MED ORDER — IOHEXOL 350 MG/ML SOLN
100.0000 mL | Freq: Once | INTRAVENOUS | Status: AC | PRN
Start: 2022-02-25 — End: 2022-02-25
  Administered 2022-02-25: 100 mL via INTRAVENOUS

## 2022-02-26 ENCOUNTER — Telehealth (HOSPITAL_BASED_OUTPATIENT_CLINIC_OR_DEPARTMENT_OTHER): Payer: Self-pay

## 2022-02-26 ENCOUNTER — Other Ambulatory Visit (HOSPITAL_BASED_OUTPATIENT_CLINIC_OR_DEPARTMENT_OTHER): Payer: Self-pay

## 2022-02-26 ENCOUNTER — Encounter (HOSPITAL_BASED_OUTPATIENT_CLINIC_OR_DEPARTMENT_OTHER): Payer: Self-pay

## 2022-02-26 DIAGNOSIS — E782 Mixed hyperlipidemia: Secondary | ICD-10-CM

## 2022-02-26 MED ORDER — ROSUVASTATIN CALCIUM 20 MG PO TABS: 20.0000 mg | ORAL_TABLET | Freq: Every day | ORAL | 3 refills | Status: DC

## 2022-02-26 MED ORDER — ROSUVASTATIN CALCIUM 20 MG PO TABS
20.0000 mg | ORAL_TABLET | Freq: Every day | ORAL | 3 refills | Status: DC
  Filled 2022-02-26: qty 90, 90d supply, fill #0

## 2022-02-26 MED ORDER — ASPIRIN 81 MG PO TBEC: 81.0000 mg | DELAYED_RELEASE_TABLET | Freq: Every day | ORAL | 3 refills | Status: DC

## 2022-02-26 NOTE — Telephone Encounter (Addendum)
Please see recent lab result encounter, for rosuvastatin and LP/LFT orders placed, results to be discussed in person 8/1, ASA 81mg  ordered.    ----- Message from , NP sent at 02/25/2022  4:18 PM EDT ----- Cardiac CT scan shows minimal nonobstructive coronary disease. This means there is a small amount of plaque in one of the heart arteries. It is less than 24%. Not enough to cause symptoms.   Plan for secondary prevention with Aspirin EC 81mg  daily, Rosuvastatin 20mg  daily. Repeat fasting lipid panel, CMP in 6 weeks (he can wait to get his currently ordered labs until 6 week mark)

## 2022-02-26 NOTE — Addendum Note (Signed)
Addended by: Marlene Lard on: 02/26/2022 01:51 PM   Modules accepted: Orders

## 2022-02-26 NOTE — Telephone Encounter (Addendum)
Seen by patient Derek Erickson on 02/26/2022  8:16 AM, meds/labs ordered, results discussed in person also.      ----- Message from Alver Sorrow, NP sent at 02/26/2022  7:56 AM EDT ----- Lipoprotein (a) is normal. Cholesterol numbers above goal - though triglycerides have improved compared to previous which is likely due his diet changes! LDL of 149 is above goal of <70. Start Rosuvastatin 20mg  daily with repeat fasting lipid panel, LFT in 6 weeks.

## 2022-03-01 LAB — TESTOSTERONE, FREE, TOTAL, SHBG
Sex Hormone Binding: 22 nmol/L (ref 16.5–55.9)
Testosterone, Free: 19.7 pg/mL (ref 6.8–21.5)
Testosterone: 698 ng/dL (ref 264–916)

## 2022-03-05 ENCOUNTER — Ambulatory Visit (HOSPITAL_BASED_OUTPATIENT_CLINIC_OR_DEPARTMENT_OTHER): Payer: 59 | Admitting: Family

## 2022-03-05 ENCOUNTER — Other Ambulatory Visit (HOSPITAL_BASED_OUTPATIENT_CLINIC_OR_DEPARTMENT_OTHER): Payer: Self-pay

## 2022-03-11 ENCOUNTER — Other Ambulatory Visit (HOSPITAL_BASED_OUTPATIENT_CLINIC_OR_DEPARTMENT_OTHER): Payer: Self-pay

## 2022-03-13 ENCOUNTER — Telehealth: Payer: Self-pay | Admitting: *Deleted

## 2022-03-13 ENCOUNTER — Other Ambulatory Visit: Payer: Self-pay | Admitting: Cardiology

## 2022-03-13 DIAGNOSIS — G4733 Obstructive sleep apnea (adult) (pediatric): Secondary | ICD-10-CM

## 2022-03-13 NOTE — Telephone Encounter (Signed)
-----   Message from Lattie Haw, RN sent at 03/09/2022  4:08 PM EDT ----- Donnie Coffin ordered by Gillian Shields ----- Message ----- From: Quintella Reichert, MD Sent: 02/22/2022  12:47 PM EDT To: [facility]  Please let patient know that they have sleep apnea and recommend treating with CPAP.  Please order an auto CPAP from 4-15cm H2O with heated humidity and mask of choice.  Order overnight pulse ox on CPAP.  Followup with me in 6 weeks.

## 2022-03-13 NOTE — Telephone Encounter (Signed)
Patient notified of sleep study results and MD recommendations. He agrees to proceed with CPAP treatment. Orders sent to [facility].

## 2022-03-18 ENCOUNTER — Other Ambulatory Visit (HOSPITAL_BASED_OUTPATIENT_CLINIC_OR_DEPARTMENT_OTHER): Payer: Self-pay

## 2022-03-19 ENCOUNTER — Other Ambulatory Visit (HOSPITAL_BASED_OUTPATIENT_CLINIC_OR_DEPARTMENT_OTHER): Payer: Self-pay | Admitting: Nurse Practitioner

## 2022-03-19 DIAGNOSIS — E291 Testicular hypofunction: Secondary | ICD-10-CM

## 2022-03-20 ENCOUNTER — Other Ambulatory Visit (HOSPITAL_BASED_OUTPATIENT_CLINIC_OR_DEPARTMENT_OTHER): Payer: Self-pay | Admitting: Nurse Practitioner

## 2022-03-20 ENCOUNTER — Other Ambulatory Visit (HOSPITAL_BASED_OUTPATIENT_CLINIC_OR_DEPARTMENT_OTHER): Payer: Self-pay

## 2022-03-20 DIAGNOSIS — E291 Testicular hypofunction: Secondary | ICD-10-CM

## 2022-03-20 MED ORDER — TESTOSTERONE CYPIONATE 200 MG/ML IM SOLN
150.0000 mg | INTRAMUSCULAR | 5 refills | Status: DC
  Filled 2022-03-20: qty 3, 28d supply, fill #0
  Filled 2022-04-15: qty 3, 21d supply, fill #1
  Filled 2022-05-10: qty 3, 21d supply, fill #2
  Filled 2022-06-03: qty 3, 21d supply, fill #3
  Filled 2022-07-10: qty 3, 21d supply, fill #4
  Filled 2022-08-07: qty 3, 21d supply, fill #5

## 2022-03-21 ENCOUNTER — Encounter (HOSPITAL_BASED_OUTPATIENT_CLINIC_OR_DEPARTMENT_OTHER): Payer: Self-pay | Admitting: Nurse Practitioner

## 2022-03-21 DIAGNOSIS — K219 Gastro-esophageal reflux disease without esophagitis: Secondary | ICD-10-CM | POA: Insufficient documentation

## 2022-03-21 NOTE — Assessment & Plan Note (Signed)
Chronic.  Previously managed with the [facility].  I we will take over medication management for him at this time.  He does not have any alarm symptoms present today.  Recommend continuation of Abilify 15 mg daily, Celexa 40 mg daily, and trazodone 100 mg at bedtime.  If he begins to experience any new or worsening emotional symptoms he will follow-up immediately.

## 2022-03-21 NOTE — Assessment & Plan Note (Signed)
Chronic finding on previous labs.  He has been working on [employer] and improved control of his blood sugars as well as diet.  We will recheck lipid panel today for monitoring.

## 2022-03-21 NOTE — Assessment & Plan Note (Signed)
Chronic.  Currently very well managed on medication previously provided through the [place].  I will take over the medication management for him at this time.  He does not have any alarm symptoms present today.  We will plan to continue medications and follow-up routinely.

## 2022-03-21 NOTE — Assessment & Plan Note (Signed)
Chronic.  Well-controlled on current regimen.  He has previously received his medications to the [facility] however I will take over medication management at this time.  We will follow-up routinely for monitoring.  No alarm symptoms present at this time.  Continue current medications and doses.

## 2022-03-21 NOTE — Assessment & Plan Note (Signed)
Recent episode of ongoing chest pain with no alarm symptoms or testing found in the emergency room.  Symptoms appear to be consistent with gastroesophageal reflux disease.  Patient has been started on a PPI and is tolerating well.  Will send refill of ondansetron for patient to have in the event that he does begin to experience nausea.  No alarm symptoms present at this time.  We will plan to follow-up as needed.

## 2022-03-21 NOTE — Assessment & Plan Note (Addendum)
Recent diagnosis of testosterone deficiency.  He is currently managed on replacement therapy.  We will repeat labs today for further evaluation.  He is responding well to medication without any known side effects.

## 2022-03-21 NOTE — Assessment & Plan Note (Signed)
Currently working on diet and exercise as well as weight management with semaglutide.  He is tolerating this well without any concerning side effects or symptoms.  Recommendation for continuation of dietary monitoring and management.  We will make changes as necessary.  Will obtain labs today.

## 2022-03-22 ENCOUNTER — Ambulatory Visit (INDEPENDENT_AMBULATORY_CARE_PROVIDER_SITE_OTHER): Payer: 59 | Admitting: Nurse Practitioner

## 2022-03-22 ENCOUNTER — Other Ambulatory Visit (HOSPITAL_BASED_OUTPATIENT_CLINIC_OR_DEPARTMENT_OTHER): Payer: Self-pay

## 2022-03-22 ENCOUNTER — Encounter (HOSPITAL_BASED_OUTPATIENT_CLINIC_OR_DEPARTMENT_OTHER): Payer: Self-pay | Admitting: Nurse Practitioner

## 2022-03-22 VITALS — BP 103/71 | HR 76 | Ht 66.0 in | Wt 211.0 lb

## 2022-03-22 DIAGNOSIS — G43001 Migraine without aura, not intractable, with status migrainosus: Secondary | ICD-10-CM | POA: Diagnosis not present

## 2022-03-22 DIAGNOSIS — Z0000 Encounter for general adult medical examination without abnormal findings: Secondary | ICD-10-CM | POA: Diagnosis not present

## 2022-03-22 DIAGNOSIS — F3340 Major depressive disorder, recurrent, in remission, unspecified: Secondary | ICD-10-CM | POA: Diagnosis not present

## 2022-03-22 DIAGNOSIS — E291 Testicular hypofunction: Secondary | ICD-10-CM | POA: Diagnosis not present

## 2022-03-22 DIAGNOSIS — F4312 Post-traumatic stress disorder, chronic: Secondary | ICD-10-CM | POA: Diagnosis not present

## 2022-03-22 MED ORDER — VERAPAMIL HCL 40 MG PO TABS
40.0000 mg | ORAL_TABLET | Freq: Three times a day (TID) | ORAL | 3 refills | Status: DC
  Filled 2022-03-22: qty 60, 20d supply, fill #0
  Filled 2022-03-25: qty 30, 10d supply, fill #0
  Filled 2022-05-21: qty 90, 30d supply, fill #1
  Filled 2022-07-10: qty 90, 30d supply, fill #2
  Filled 2022-08-07: qty 90, 30d supply, fill #3

## 2022-03-22 NOTE — Progress Notes (Signed)
BP 103/71   Pulse 76   Ht 5\' 6"  (1.676 m)   Wt 211 lb (95.7 kg)   SpO2 99%   BMI 34.06 kg/m    Subjective:    Patient ID: Derek Erickson, male    DOB: [birth year], 46 y.o.   MRN: [id number]  HPI: Derek Erickson is a 46 y.o. male presenting on 03/22/2022 for comprehensive medical examination.   Current medical concerns include:{Blank single:19197::"none","***"} He is scheduled to get his CPAP on Tuesday. He is hoping that this will help with his sleep. He wakes up frequently in the night with nightmares. In the past prazosin has helped with this, but this dropped his BP too low and he passed out.    He reports regular vision exams q1-5y: {Blank single:19197::"yes","no"} He reports regular dental exams q 39m: {Blank single:19197::"yes","no"} His diet consists of: {Blank single:19197::"***"} He endorses exercise and/or activity of: {Blank single:19197::"***"} He works as: {Blank single:19197::"***"}  He {Blank single:19197::"denies","endorses"} ETOH use ({Blank single:19197::"***"}) He {Blank single:19197::"denies","endorses"} nictoine use ({Blank single:19197::"***"}) He {Blank single:19197::"denies","endorses"} illegal substance use ({Blank single:19197::"***"}) He is {sexual partners:315163}  He {Blank single:19197::"denies","endorses"} concerns today about STI  He {Blank single:19197::"endorses","denies"} concerns about skin changes today ({SKIN/BREAST 11m) He {Blank single:19197::"endorses","denies"} concerns about bowel changes today ({SYMPTOMS; HWE:99371}) He {Blank single:19197::"endorses","denies"} concerns about bladder changes today ({Symptoms; bladder:10412})  Most Recent Depression Screen:     03/21/2022    7:57 PM 02/07/2021    2:50 PM 11/28/2020    4:21 PM  Depression screen PHQ 2/9  Decreased Interest 0 0 1  Down, Depressed, Hopeless 0 0 1  PHQ - 2 Score 0 0 2  Altered sleeping   3  Tired, decreased energy   3  Change in appetite   1  Feeling bad or failure  about yourself    0  Trouble concentrating   1  Moving slowly or fidgety/restless   1  Suicidal thoughts   0  PHQ-9 Score   11  Difficult doing work/chores   Somewhat difficult   Most Recent Anxiety Screen:    11/28/2020    4:22 PM  GAD 7 : Generalized Anxiety Score  Nervous, Anxious, on Edge 0  Control/stop worrying 0  Worry too much - different things 0  Trouble relaxing 1  Restless 1  Easily annoyed or irritable 0  Afraid - awful might happen 0  Total GAD 7 Score 2  Anxiety Difficulty Not difficult at all   Most Recent Falls Screen:    03/22/2022   10:46 AM 02/07/2021    2:50 PM  Fall Risk   Falls in the past year? 0 0  Number falls in past yr: 0   Injury with Fall? 0   Risk for fall due to : No Fall Risks   Follow up Education provided;Falls evaluation completed     Past medical history, surgical history, medications, allergies, family history and social history reviewed with patient today and changes made to appropriate areas of the chart.  Past Medical History:  Past Medical History:  Diagnosis Date  . Anxiety   . Depression   . Rash of finger 07/10/2021   Medications:  Current Outpatient Medications on File Prior to Visit  Medication Sig  . ARIPiprazole (ABILIFY) 15 MG tablet Take 1 tablet (15 mg total) by mouth daily.  07/12/2021 aspirin EC 81 MG tablet Take 1 tablet (81 mg total) by mouth daily. Swallow whole.  . citalopram (CELEXA) 40 MG tablet Take 1 tablet (40 mg total)  by mouth daily.  . hydrocortisone (ANUSOL-HC) 25 MG suppository Place 1 suppository (25 mg total) rectally 2 (two) times daily as needed for hemorrhoids.  . metoprolol tartrate (LOPRESSOR) 100 MG tablet Take one tablet two hours prior to cardiac CTA.  Marland Kitchen NEEDLE, DISP, 22 G 22G X 1-1/2" MISC For IM testosterone injection. Once every 14 days.  . ondansetron (ZOFRAN) 8 MG tablet Take 1 tablet (8 mg total) by mouth every 8 (eight) hours as needed for nausea or vomiting.  . pantoprazole (PROTONIX) 20 MG  tablet Take one tablet daily for 2 weeks. Then take as needed for indigestion, acid reflux.  . Rimegepant Sulfate (NURTEC) 75 MG TBDP Dissolve 1 tab in mouth at the first sign of migraine. May repeat in 2 hours if symptoms are not improved. Do not take more than 2 doses in 24 hours .  . rosuvastatin (CRESTOR) 20 MG tablet Take 1 tablet (20 mg total) by mouth daily.  Melene Muller ON 04/21/2022] Semaglutide-Weight Management 1.7 MG/0.75ML SOAJ Inject 1.7 mg into the skin once a week for 28 days.  . Semaglutide-Weight Management 1.7 MG/0.75ML SOAJ Inject 1.7 mg into the skin once a week.  . SYRINGE-NEEDLE, DISP, 3 ML (B-D 3CC LUER-LOK SYR 22GX1") 22G X 1" 3 ML MISC Use to inject testosterone  . tamsulosin (FLOMAX) 0.4 MG CAPS capsule Take 1 capsule (0.4 mg total) by mouth daily.  Marland Kitchen testosterone cypionate (DEPOTESTOSTERONE CYPIONATE) 200 MG/ML injection Inject 0.75 mLs (150 mg total) into the muscle every 7 (seven) days.  . traZODone (DESYREL) 100 MG tablet Take 1 tablet (100 mg total) by mouth at bedtime.  . verapamil (CALAN) 40 MG tablet Take 1 tablet (40 mg total) by mouth 3 (three) times daily.  . Vitamin D, Ergocalciferol, (DRISDOL) 1.25 MG (50000 UNIT) CAPS capsule Take 1 capsule (50,000 Units total) by mouth every 7 (seven) days. Take for 12 total weeks.   No current facility-administered medications on file prior to visit.   Surgical History:  Past Surgical History:  Procedure Laterality Date  . TONSILLECTOMY     Allergies:  Allergies  Allergen Reactions  . Prazosin Other (See Comments)    LOC and Dizziness    Social History:  Social History   Socioeconomic History  . Marital status: Married    Spouse name: Not on file  . Number of children: Not on file  . Years of education: Not on file  . Highest education level: Not on file  Occupational History  . Not on file  Tobacco Use  . Smoking status: Never    Passive exposure: Past  . Smokeless tobacco: Current    Types: Chew   Vaping Use  . Vaping Use: Never used  Substance and Sexual Activity  . Alcohol use: Yes  . Drug use: Never  . Sexual activity: Yes    Birth control/protection: None  Other Topics Concern  . Not on file  Social History Narrative  . Not on file   Social Determinants of Health   Financial Resource Strain: Not on file  Food Insecurity: Not on file  Transportation Needs: Not on file  Physical Activity: Not on file  Stress: Not on file  Social Connections: Not on file  Intimate Partner Violence: Not on file   Social History   Tobacco Use  Smoking Status Never  . Passive exposure: Past  Smokeless Tobacco Current  . Types: Chew   Social History   Substance and Sexual Activity  Alcohol Use Yes  Family History:  Family History  Problem Relation Age of Onset  . Diabetes Mother   . Hyperlipidemia Father   . Hypertension Father   . Stroke Father   . Heart attack Father   . Heart disease Father   . Dementia Maternal Grandmother   . Cancer Maternal Grandfather      All ROS negative except what is listed above and in the HPI.      Objective:    BP 103/71   Pulse 76   Ht 5\' 6"  (1.676 m)   Wt 211 lb (95.7 kg)   SpO2 99%   BMI 34.06 kg/m   Wt Readings from Last 3 Encounters:  03/22/22 211 lb (95.7 kg)  02/08/22 216 lb 12.8 oz (98.3 kg)  02/01/22 213 lb (96.6 kg)    Physical Exam  Results for orders placed or performed in visit on 02/22/22  Testosterone, Free, Total, SHBG  Result Value Ref Range   Testosterone 698 264 - 916 ng/dL   Testosterone, Free 02/24/22 6.8 - 21.5 pg/mL   Sex Hormone Binding 22.0 16.5 - 55.9 nmol/L      Assessment & Plan:   Problem List Items Addressed This Visit   None    Follow up plan: NEXT PREVENTATIVE PHYSICAL DUE IN 1 YEAR. No follow-ups on file.  LABORATORY TESTING:  Health maintenance labs ordered today, if applicable.  - STI testing: {Blank single:19197::"deferred","done today","not applicable","up to date","done  elsewhere"}  IMMUNIZATIONS:   - Tdap: Tetanus vaccination status reviewed: {tetanus status:315746}. - Influenza: {Blank single:19197::"Up to date","Administered today","Postponed to flu season","Refused","Given elsewhere"} - Pneumovax: {Blank single:19197::"Up to date","Administered today","Not applicable","Refused","Given elsewhere"} - Prevnar: {Blank single:19197::"Up to date","Administered today","Not applicable","Refused","Given elsewhere"} - HPV: {Blank single:19197::"Up to date","Administered today","Not applicable","Refused","Given elsewhere"} - Zostavax vaccine: {Blank single:19197::"Up to date","Administered today","Not applicable","Refused","Given elsewhere"}  SCREENING: - Colonoscopy: {Blank single:19197::"Up to date","Ordered today","Not applicable","Refused","Done elsewhere"}  Discussed with patient purpose of the colonoscopy is to detect colon cancer at curable precancerous or Kerrilyn Azbill stages  - AAA Screening: {Blank single:19197::"Up to date","Ordered today","Not applicable","Refused","Done elsewhere"}  - Hearing Test: {Blank single:19197::"Up to date","Ordered today","Not applicable","Refused","Done elsewhere"}  - Spirometry: {Blank single:19197::"Up to date","Ordered today","Not applicable","Refused","Done elsewhere"}  - PSA: {Blank single:19197::"Up to date","Ordered today","Not applicable","Refused","Done elsewhere"}   PATIENT COUNSELING:   For all adult patients, I recommend A well balanced diet low in saturated fats, cholesterol, and moderation in carbohydrates.   This can be as simple as monitoring portion sizes and cutting back on sugary beverages such as soda and juice to start with.    Daily water consumption of at least 64 ounces.  Physical activity at least 180 minutes per week, if just starting out.   This can be as simple as taking the stairs instead of the elevator and walking 2-3 laps around the office  purposefully every day.   STD protection, partner  selection, and regular testing if high risk.  Limited consumption of alcoholic beverages if alcohol is consumed.  For women, I recommend no more than 7 alcoholic beverages per week, spread out throughout the week.  Avoid "binge" drinking or consuming large quantities of alcohol in one setting.   Please let me know if you feel you may need help with reduction or quitting alcohol consumption.   Avoidance of nicotine, if used.  Please let me know if you feel you may need help with reduction or quitting nicotine use.   Daily mental health attention.  This can be in the form of 5 minute daily meditation, prayer, journaling, yoga,  reflection, etc.   Purposeful attention to your emotions and mental state can significantly improve your overall wellbeing  and  Health.  Please know that I am here to help you with all of your health care goals and am happy to work with you to find a solution that works best for you.  The greatest advice I have received with any changes in life are to take it one step at a time, that even means if all you can focus on is the next 60 seconds, then do that and celebrate your victories.  With any changes in life, you will have set backs, and that is OK. The important thing to remember is, if you have a set back, it is not a failure, it is an opportunity to try again!  Health Maintenance Recommendations Screening Testing Mammogram Every 1 -2 years based on history and risk factors Starting at age 78 Pap Smear Ages 21-39 every 3 years Ages 13-65 every 5 years with HPV testing More frequent testing may be required based on results and history Colon Cancer Screening Every 1-10 years based on test performed, risk factors, and history Starting at age 43 Bone Density Screening Every 2-10 years based on history Starting at age 441 for women Recommendations for men differ based on medication usage, history, and risk factors AAA Screening One time ultrasound Men 32-67  years old who have every smoked Lung Cancer Screening Low Dose Lung CT every 12 months Age 65-80 years with a 30 pack-year smoking history who still smoke or who have quit within the last 15 years  Screening Labs Routine  Labs: Complete Blood Count (CBC), Complete Metabolic Panel (CMP), Cholesterol (Lipid Panel) Every 6-12 months based on history and medications May be recommended more frequently based on current conditions or previous results Hemoglobin A1c Lab Every 3-12 months based on history and previous results Starting at age 5 or earlier with diagnosis of diabetes, high cholesterol, BMI >26, and/or risk factors Frequent monitoring for patients with diabetes to ensure blood sugar control Thyroid Panel (TSH w/ T3 & T4) Every 6 months based on history, symptoms, and risk factors May be repeated more often if on medication HIV One time testing for all patients 76 and older May be repeated more frequently for patients with increased risk factors or exposure Hepatitis C One time testing for all patients 51 and older May be repeated more frequently for patients with increased risk factors or exposure Gonorrhea, Chlamydia Every 12 months for all sexually active persons 13-24 years Additional monitoring may be recommended for those who are considered high risk or who have symptoms PSA Men 33-98 years old with risk factors Additional screening may be recommended from age 43-69 based on risk factors, symptoms, and history  Vaccine Recommendations Tetanus Booster All adults every 10 years Flu Vaccine All patients 6 months and older every year COVID Vaccine All patients 12 years and older Initial dosing with booster May recommend additional booster based on age and health history HPV Vaccine 2 doses all patients age 44-26 Dosing may be considered for patients over 26 Shingles Vaccine (Shingrix) 2 doses all adults 55 years and older Pneumonia (Pneumovax 23) All adults 65 years  and older May recommend earlier dosing based on health history Pneumonia (Prevnar 5) All adults 65 years and older Dosed 1 year after Pneumovax 23  Additional Screening, Testing, and Vaccinations may be recommended on an individualized basis based on family history, health history, risk factors, and/or exposure.

## 2022-03-22 NOTE — Patient Instructions (Addendum)
It was a pleasure seeing you today. I hope your time spent with [place] was pleasant and helpful. Please let us know if there is anything we can do to improve the service you receive.   Please let me know how your cpap works for you. I really hope this helps with your sleep!!  Your exam looked great!    Important Office Information Lab Results If labs were ordered, please note that you will see results through MyChart as soon as they come available from [facility].  It takes up to 5 business days for the results to be routed to me and for me to review them once all of the lab results have come through from [facility]. I will make recommendations based on your results and send these through MyChart or someone from the office will call you to discuss. If your labs are abnormal, we may contact you to schedule a visit to discuss the results and make recommendations.  If you have not heard from [place] within 5 business days or you have waited longer than a week and your lab results have not come through on MyChart, please feel free to call the office or send a message through MyChart to follow-up on these labs.   Referrals If referrals were placed today, the office where the referral was sent will contact you either by phone or through MyChart to set up scheduling. Please note that it can take up to a week for the referral office to contact you. If you do not hear from them in a week, please contact the referral office directly to inquire about scheduling.   Condition Treated If your condition worsens or you begin to have new symptoms, please schedule a follow-up appointment for further evaluation. If you are not sure if an appointment is needed, you may call the office to leave a message for the nurse and someone will contact you with recommendations.  If you have an urgent or life threatening emergency, please do not call the office, but seek emergency evaluation by calling 911 or going to the nearest emergency room  for evaluation.   MyChart and Phone Calls Please do not use MyChart for urgent messages. It may take up to 3 business days for MyChart messages to be read by staff and if they are unable to handle the request, an additional 3 business days for them to be routed to me and for my response.  Messages sent to the provider through MyChart do not come directly to the provider, please allow time for these messages to be routed and for me to respond.  We get a large volume of MyChart messages daily and these are responded to in the order received.   For urgent messages, please call the office at [phone] and speak with the front office staff or leave a message on the line of my assistant for guidance.  We are seeing patients from the hours of 8:00 am through 5:00 pm and calls directly to the nurse may not be answered immediately due to seeing patients, but your call will be returned as soon as possible.  Phone  messages received after 4:00 PM Monday through Thursday may not be returned until the following business day. Phone messages received after 11:00 AM on Friday may not be returned until Monday.   After Hours We share on call hours with providers from other offices. If you have an urgent need after hours that cannot wait until the next business day, please  contact the on call provider by calling the office number. A nurse will speak with you and contact the provider if needed for recommendations.  If you have an urgent or life threatening emergency after hours, please do not call the on call provider, but seek emergency evaluation by calling 911 or going to the nearest emergency room for evaluation.   Paperwork All paperwork requires a minimum of 5 days to complete and return to you or the designated personnel. Please keep this in mind when bringing in forms or sending requests for paperwork completion to the office.

## 2022-03-25 ENCOUNTER — Other Ambulatory Visit (HOSPITAL_BASED_OUTPATIENT_CLINIC_OR_DEPARTMENT_OTHER): Payer: Self-pay

## 2022-03-27 DIAGNOSIS — Z0000 Encounter for general adult medical examination without abnormal findings: Secondary | ICD-10-CM | POA: Insufficient documentation

## 2022-03-27 NOTE — Assessment & Plan Note (Signed)
CPE today with no alarm symptoms present.  Discussion on exercise and diet provided.  Recommendations for health maintenance activities discussed.  Labs reviewed.  We will plan to continue to monitor chronic conditions routinely.  We will plan to follow-up in 1 year or sooner if needed.

## 2022-03-27 NOTE — Assessment & Plan Note (Signed)
Chronic PTSD fairly well controlled at this time with medication.  He does have some difficulty sleeping with nightmares frequently however he tells me that he is able to go back to sleep.  Recommend continuation of current medications and monitoring for new or worsening symptoms.  I am hopeful that once he starts with the CPAP the nightmares will become less frequent and hopefully stop for him.  We will plan to follow-up periodically.

## 2022-03-27 NOTE — Assessment & Plan Note (Signed)
Chronic.  Well-controlled with Nurtec.  No alarm symptoms present at this time.  Continue current medication.

## 2022-03-27 NOTE — Assessment & Plan Note (Signed)
Chronic.  He is currently on testosterone replacement therapy and is doing well.  We will plan to monitor labs.  We will send refills of medications.  No alarm symptoms are present at this time.

## 2022-03-27 NOTE — Assessment & Plan Note (Signed)
Chronic.  He is currently stable on his medications.  I have recently taken over prescribing medications from the [facility] for him.  No alarm symptoms are present at this time.  He is aware that she can reach out at any time if his symptoms seem to worsen or new symptoms begin to develop.

## 2022-03-29 DIAGNOSIS — G4733 Obstructive sleep apnea (adult) (pediatric): Secondary | ICD-10-CM | POA: Diagnosis not present

## 2022-04-08 ENCOUNTER — Other Ambulatory Visit (HOSPITAL_BASED_OUTPATIENT_CLINIC_OR_DEPARTMENT_OTHER): Payer: Self-pay | Admitting: Nurse Practitioner

## 2022-04-08 ENCOUNTER — Other Ambulatory Visit (HOSPITAL_BASED_OUTPATIENT_CLINIC_OR_DEPARTMENT_OTHER): Payer: Self-pay

## 2022-04-08 DIAGNOSIS — J0140 Acute pansinusitis, unspecified: Secondary | ICD-10-CM

## 2022-04-08 MED ORDER — AZITHROMYCIN 250 MG PO TABS
ORAL_TABLET | ORAL | 0 refills | Status: AC
  Filled 2022-04-08: qty 6, 5d supply, fill #0

## 2022-04-08 MED ORDER — GUAIFENESIN ER 600 MG PO TB12
1200.0000 mg | ORAL_TABLET | Freq: Two times a day (BID) | ORAL | 1 refills | Status: DC | PRN
  Filled 2022-04-08: qty 20, 5d supply, fill #0

## 2022-04-10 ENCOUNTER — Other Ambulatory Visit (HOSPITAL_BASED_OUTPATIENT_CLINIC_OR_DEPARTMENT_OTHER): Payer: Self-pay

## 2022-04-11 ENCOUNTER — Other Ambulatory Visit (HOSPITAL_BASED_OUTPATIENT_CLINIC_OR_DEPARTMENT_OTHER): Payer: Self-pay

## 2022-04-11 MED ORDER — INFLUENZA VAC SPLIT QUAD 0.5 ML IM SUSY
PREFILLED_SYRINGE | INTRAMUSCULAR | 0 refills | Status: DC
  Filled 2022-04-11: qty 0.5, 1d supply, fill #0

## 2022-04-12 DIAGNOSIS — E782 Mixed hyperlipidemia: Secondary | ICD-10-CM | POA: Diagnosis not present

## 2022-04-13 LAB — HEPATIC FUNCTION PANEL
ALT: 20 IU/L (ref 0–44)
AST: 19 IU/L (ref 0–40)
Albumin: 4.8 g/dL (ref 4.1–5.1)
Alkaline Phosphatase: 57 IU/L (ref 44–121)
Bilirubin Total: 0.3 mg/dL (ref 0.0–1.2)
Bilirubin, Direct: <0.1 mg/dL (ref 0.00–0.40)
Total Protein: 7.1 g/dL (ref 6.0–8.5)

## 2022-04-13 LAB — LIPID PANEL
Chol/HDL Ratio: 3.9 ratio (ref 0.0–5.0)
Cholesterol, Total: 152 mg/dL (ref 100–199)
HDL: 39 mg/dL — ABNORMAL LOW (ref >39)
LDL Chol Calc (NIH): 84 mg/dL (ref 0–99)
Triglycerides: 170 mg/dL — ABNORMAL HIGH (ref 0–149)
VLDL Cholesterol Cal: 29 mg/dL (ref 5–40)

## 2022-04-15 ENCOUNTER — Other Ambulatory Visit (HOSPITAL_BASED_OUTPATIENT_CLINIC_OR_DEPARTMENT_OTHER): Payer: Self-pay

## 2022-04-15 ENCOUNTER — Telehealth (HOSPITAL_BASED_OUTPATIENT_CLINIC_OR_DEPARTMENT_OTHER): Payer: Self-pay

## 2022-04-15 DIAGNOSIS — E782 Mixed hyperlipidemia: Secondary | ICD-10-CM

## 2022-04-15 MED ORDER — ROSUVASTATIN CALCIUM 20 MG PO TABS
20.0000 mg | ORAL_TABLET | Freq: Every day | ORAL | 3 refills | Status: DC
  Filled 2022-04-15 – 2022-05-21 (×2): qty 90, 90d supply, fill #0
  Filled 2022-08-26: qty 90, 90d supply, fill #1
  Filled 2023-03-21: qty 30, 30d supply, fill #2

## 2022-04-15 NOTE — Telephone Encounter (Addendum)
Discussed in person. Orders placed!    ----- Message from Loel Dubonnet, NP sent at 04/15/2022  7:40 AM EDT ----- Normal liver enzymes.  Cholesterol panel shows much improvement! LDL (bad cholesterol) improved from 149 to 84! Still slightly above goal of less than 70. It is very difficult to get LDL to goal with just diet/exercise. Recommend increase Rosuvastatin to 40mg  daily with FLP/LFT in 2 months.

## 2022-04-28 DIAGNOSIS — G4733 Obstructive sleep apnea (adult) (pediatric): Secondary | ICD-10-CM | POA: Diagnosis not present

## 2022-04-29 ENCOUNTER — Other Ambulatory Visit (HOSPITAL_BASED_OUTPATIENT_CLINIC_OR_DEPARTMENT_OTHER): Payer: Self-pay

## 2022-04-30 ENCOUNTER — Other Ambulatory Visit (HOSPITAL_BASED_OUTPATIENT_CLINIC_OR_DEPARTMENT_OTHER): Payer: Self-pay

## 2022-05-06 ENCOUNTER — Other Ambulatory Visit (HOSPITAL_BASED_OUTPATIENT_CLINIC_OR_DEPARTMENT_OTHER): Payer: Self-pay | Admitting: Nurse Practitioner

## 2022-05-06 ENCOUNTER — Other Ambulatory Visit (HOSPITAL_BASED_OUTPATIENT_CLINIC_OR_DEPARTMENT_OTHER): Payer: Self-pay

## 2022-05-06 DIAGNOSIS — F4312 Post-traumatic stress disorder, chronic: Secondary | ICD-10-CM

## 2022-05-06 DIAGNOSIS — F3340 Major depressive disorder, recurrent, in remission, unspecified: Secondary | ICD-10-CM

## 2022-05-06 DIAGNOSIS — F411 Generalized anxiety disorder: Secondary | ICD-10-CM

## 2022-05-06 MED ORDER — ALPRAZOLAM 0.25 MG PO TABS
0.2500 mg | ORAL_TABLET | Freq: Two times a day (BID) | ORAL | 2 refills | Status: DC | PRN
  Filled 2022-05-06: qty 30, 15d supply, fill #0
  Filled 2022-06-14: qty 30, 15d supply, fill #1
  Filled 2022-09-19: qty 30, 15d supply, fill #2

## 2022-05-10 ENCOUNTER — Other Ambulatory Visit (HOSPITAL_BASED_OUTPATIENT_CLINIC_OR_DEPARTMENT_OTHER): Payer: Self-pay

## 2022-05-21 ENCOUNTER — Other Ambulatory Visit (HOSPITAL_BASED_OUTPATIENT_CLINIC_OR_DEPARTMENT_OTHER): Payer: Self-pay

## 2022-05-22 ENCOUNTER — Other Ambulatory Visit (HOSPITAL_BASED_OUTPATIENT_CLINIC_OR_DEPARTMENT_OTHER): Payer: Self-pay

## 2022-05-23 ENCOUNTER — Other Ambulatory Visit (HOSPITAL_BASED_OUTPATIENT_CLINIC_OR_DEPARTMENT_OTHER): Payer: Self-pay

## 2022-05-25 ENCOUNTER — Other Ambulatory Visit (HOSPITAL_BASED_OUTPATIENT_CLINIC_OR_DEPARTMENT_OTHER): Payer: Self-pay

## 2022-05-28 ENCOUNTER — Other Ambulatory Visit (HOSPITAL_BASED_OUTPATIENT_CLINIC_OR_DEPARTMENT_OTHER): Payer: Self-pay

## 2022-05-29 ENCOUNTER — Other Ambulatory Visit (HOSPITAL_BASED_OUTPATIENT_CLINIC_OR_DEPARTMENT_OTHER): Payer: Self-pay

## 2022-05-29 DIAGNOSIS — G4733 Obstructive sleep apnea (adult) (pediatric): Secondary | ICD-10-CM | POA: Diagnosis not present

## 2022-05-30 ENCOUNTER — Other Ambulatory Visit (HOSPITAL_BASED_OUTPATIENT_CLINIC_OR_DEPARTMENT_OTHER): Payer: Self-pay

## 2022-05-31 ENCOUNTER — Other Ambulatory Visit (HOSPITAL_BASED_OUTPATIENT_CLINIC_OR_DEPARTMENT_OTHER): Payer: Self-pay

## 2022-05-31 ENCOUNTER — Encounter (HOSPITAL_BASED_OUTPATIENT_CLINIC_OR_DEPARTMENT_OTHER): Payer: Self-pay

## 2022-06-04 ENCOUNTER — Other Ambulatory Visit (HOSPITAL_BASED_OUTPATIENT_CLINIC_OR_DEPARTMENT_OTHER): Payer: Self-pay

## 2022-06-11 ENCOUNTER — Encounter (HOSPITAL_BASED_OUTPATIENT_CLINIC_OR_DEPARTMENT_OTHER): Payer: Self-pay | Admitting: Nurse Practitioner

## 2022-06-13 DIAGNOSIS — E782 Mixed hyperlipidemia: Secondary | ICD-10-CM | POA: Diagnosis not present

## 2022-06-14 ENCOUNTER — Other Ambulatory Visit (HOSPITAL_BASED_OUTPATIENT_CLINIC_OR_DEPARTMENT_OTHER): Payer: Self-pay

## 2022-06-14 ENCOUNTER — Other Ambulatory Visit (HOSPITAL_BASED_OUTPATIENT_CLINIC_OR_DEPARTMENT_OTHER): Payer: Self-pay | Admitting: Nurse Practitioner

## 2022-06-14 DIAGNOSIS — L308 Other specified dermatitis: Secondary | ICD-10-CM

## 2022-06-14 LAB — HEPATIC FUNCTION PANEL
ALT: 20 IU/L (ref 0–44)
AST: 24 IU/L (ref 0–40)
Albumin: 4.8 g/dL (ref 4.1–5.1)
Alkaline Phosphatase: 54 IU/L (ref 44–121)
Bilirubin Total: 0.4 mg/dL (ref 0.0–1.2)
Bilirubin, Direct: 0.14 mg/dL (ref 0.00–0.40)
Total Protein: 7.6 g/dL (ref 6.0–8.5)

## 2022-06-14 LAB — LIPID PANEL
Chol/HDL Ratio: 3.6 ratio (ref 0.0–5.0)
Cholesterol, Total: 138 mg/dL (ref 100–199)
HDL: 38 mg/dL — ABNORMAL LOW (ref >39)
LDL Chol Calc (NIH): 76 mg/dL (ref 0–99)
Triglycerides: 132 mg/dL (ref 0–149)
VLDL Cholesterol Cal: 24 mg/dL (ref 5–40)

## 2022-06-14 MED ORDER — CLOTRIMAZOLE-BETAMETHASONE 1-0.05 % EX CREA
1.0000 | TOPICAL_CREAM | Freq: Every evening | CUTANEOUS | 0 refills | Status: DC
  Filled 2022-06-14: qty 15, 15d supply, fill #0

## 2022-06-25 ENCOUNTER — Telehealth: Payer: Self-pay | Admitting: *Deleted

## 2022-06-25 ENCOUNTER — Encounter: Payer: Self-pay | Admitting: Cardiology

## 2022-06-25 ENCOUNTER — Ambulatory Visit: Payer: 59 | Attending: Cardiology | Admitting: Cardiology

## 2022-06-25 VITALS — HR 90 | Ht 66.0 in | Wt 211.0 lb

## 2022-06-25 DIAGNOSIS — G4733 Obstructive sleep apnea (adult) (pediatric): Secondary | ICD-10-CM | POA: Diagnosis not present

## 2022-06-25 DIAGNOSIS — R0683 Snoring: Secondary | ICD-10-CM

## 2022-06-25 NOTE — Progress Notes (Signed)
SLEEP MEDICINE VIRTUAL CONSULT NOTEVideo Note   Because of Derek Erickson's co-morbid illnesses, he is at least at moderate risk for complications without adequate follow up.  This format is felt to be most appropriate for this patient at this time.  All issues noted in this document were discussed and addressed.  A limited physical exam was performed with this format.  Please refer to the patient's chart for his consent to telehealth for [name].      Date:  06/25/2022   ID:  Derek Erickson, DOB 07-Feb-1976, MRN [id number] The patient was identified using 2 identifiers.  Patient Location: Home Provider Location: Home Office   PCP:  Early, Sung Amabile, NP   [facility] Providers Cardiologist:  None     Evaluation Performed:  Consultation - [place] Ganoe was referred by Gillian Shields NP for the evaluation of OSA.  Chief Complaint:  OSA  History of Present Illness:    Derek Erickson is a 46 y.o. male who is being seen today for the evaluation of OSA at the request of Gillian Shields NP.  Derek Erickson is a 46 y.o. male with a hx of depression, anxiety, HLD and chest pain.  At recent [place] with Cardiology he complained of snoring and excessive daytime sleepiness with STOP BANG score of 4.  He underwent HST which showed mild OSA with an AHI of 13.7/hr and was placed on auto CPAP from 4 to 15cm H2O.  He is now referred to establish care with Sleep Physician.   He has been frustrated with his CPAP device because the hose gets wrapped up and pulls his mask off.   He tolerates the full face mask and feels the pressure is adequate.  Since going on PAP he feels rested in the am and has no significant daytime sleepiness if he has slept well the night before.  He denies any significant mouth or nasal dryness or nasal congestion.  He does not think that he snores.     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     Allergies:   Prazosin   Social History   Tobacco Use   Smoking status: Never    Passive exposure: Past   Smokeless tobacco: Current    Types: Chew  Vaping Use   Vaping Use: Never used  Substance Use Topics   Alcohol use: Yes   Drug use: Never     Family Hx: The patient's family history includes Cancer in his maternal grandfather; Dementia in his maternal grandmother; Diabetes in his mother; Heart attack in his father; Heart disease in his father; Hyperlipidemia in his father; Hypertension in his father; Stroke in his father.  ROS:   Please see the history of present illness.     All other systems reviewed and are negative.   Prior Sleep studies:   The following studies were reviewed today:  HST< PAP compliance download  Labs/Other Tests and Data Reviewed:     Recent Labs: 09/11/2021: TSH 1.550 01/27/2022: BUN 12;  Creatinine, Ser 0.86; Potassium 4.1; Sodium 135 01/30/2022: Hemoglobin 15.0; Platelets 395 06/13/2022: ALT 20    Wt Readings from Last 3 Encounters:  06/25/22 211 lb (95.7 kg)  03/22/22 211 lb (95.7 kg)  02/08/22 216 lb 12.8 oz (98.3 kg)     Risk Assessment/Calculations:      STOP-Bang Score:  4      Objective:    Vital Signs:  Pulse 90   Ht 5\' 6"  (1.676 m)   Wt 211 lb (95.7 kg)   BMI 34.06 kg/m    VITAL SIGNS:  reviewed GEN:  no acute distress EYES:  sclerae anicteric, EOMI - Extraocular Movements Intact RESPIRATORY:  normal respiratory effort, symmetric expansion CARDIOVASCULAR:  no peripheral edema SKIN:  no rash, lesions or ulcers. MUSCULOSKELETAL:  no obvious deformities. NEURO:  alert  and oriented x 3, no obvious focal deficit PSYCH:  normal affect  ASSESSMENT & PLAN:    OSA - The patient is tolerating PAP therapy well without any problems. The PAP download performed by his DME was personally reviewed and interpreted by me today and showed an AHI of 0.7/hr on auto CPAP from 4 to 15 cm H2O with 10% compliance in using more than 4 hours nightly.  The patient has been using and benefiting from PAP use and will continue to benefit from therapy.  -I am going to place an order to see if we can get a longer tubing as this seems to be why can is not compliant in using 4 hours nightly as it gets wrapped up with him.  I am also going to see if we can get the same mask but with the tubing coming off the top.  -I encouraged him to be more compliant with his device.   Time:   Today, I have spent 15 minutes with the patient with telehealth technology discussing the above problems.     Medication Adjustments/Labs and Tests Ordered: Current medicines are reviewed at length with the patient today.  Concerns regarding medicines are outlined above.   Tests Ordered: No orders of the defined types were placed in this encounter.   Medication Changes: No orders of the defined types were placed in this encounter.   Follow Up:  Video visit 8 weeks  Signed, , MD  06/25/2022 8:49 AM    [facility]

## 2022-06-25 NOTE — Patient Instructions (Signed)
Medication Instructions:  Your physician recommends that you continue on your current medications as directed. Please refer to the Current Medication list given to you today.  *If you need a refill on your cardiac medications before your next appointment, please call your pharmacy*  Follow-Up: At [facility], you and your health needs are our priority.  As part of our continuing mission to provide you with exceptional heart care, we have created designated Provider Care Teams.  These Care Teams include your primary Cardiologist (physician) and Advanced Practice Providers (APPs -  Physician Assistants and Nurse Practitioners) who all work together to provide you with the care you need, when you need it.   Your next appointment:   07/31/2022 at 10am   The format for your next appointment:   Virtual Visit   Provider:   Dr. Mayford Knife   Other Instructions Coralee North will order the additional tubing and new mask for you.   Important Information About Sugar

## 2022-06-25 NOTE — Telephone Encounter (Addendum)
Order placed to [facility] via community message longer tube for him and also get a new mask - he needs the same mask but with the tubing coming off at the top of his head because he is getting wrapped up in his tubing.

## 2022-06-25 NOTE — Telephone Encounter (Signed)
-----   Message from Mary A Robertson, RN sent at 06/25/2022  8:58 AM EST ----- Hi Nina  This is from [place]: Can you send a message to Nina to call DME to see if we can get a longer tube for him and also get a new mask - he needs the same mask but with the tubing coming off at the top of his head because he is getting wrapped up in his tubing.   Thanks Mary Anne  

## 2022-06-27 DIAGNOSIS — G4733 Obstructive sleep apnea (adult) (pediatric): Secondary | ICD-10-CM | POA: Diagnosis not present

## 2022-06-28 ENCOUNTER — Telehealth: Payer: Self-pay | Admitting: *Deleted

## 2022-06-28 DIAGNOSIS — G4733 Obstructive sleep apnea (adult) (pediatric): Secondary | ICD-10-CM | POA: Diagnosis not present

## 2022-06-28 DIAGNOSIS — R0683 Snoring: Secondary | ICD-10-CM

## 2022-06-28 NOTE — Telephone Encounter (Signed)
-----   Message from Eilleen Kempf, RN sent at 06/25/2022  8:58 AM EST ----- Hi Coralee North  This is from [place]: Can you send a message to Coralee North to call DME to see if we can get a longer tube for him and also get a new mask - he needs the same mask but with the tubing coming off at the top of his head because he is getting wrapped up in his tubing.   Thanks [place]

## 2022-06-28 NOTE — Telephone Encounter (Signed)
This encounter was created in error - please disregard.

## 2022-06-28 NOTE — Addendum Note (Signed)
Addended by: Reesa Chew on: 06/28/2022 02:54 PM   Modules accepted: Orders, Level of Service

## 2022-07-10 ENCOUNTER — Other Ambulatory Visit: Payer: Self-pay

## 2022-07-10 ENCOUNTER — Other Ambulatory Visit (HOSPITAL_COMMUNITY): Payer: Self-pay

## 2022-07-10 ENCOUNTER — Other Ambulatory Visit (HOSPITAL_BASED_OUTPATIENT_CLINIC_OR_DEPARTMENT_OTHER): Payer: Self-pay

## 2022-07-29 NOTE — Progress Notes (Unsigned)
SLEEP MEDICINE VIRTUAL CONSULT NOTEVideo Note   Because of Derek Erickson's co-morbid illnesses, he is at least at moderate risk for complications without adequate follow up.  This format is felt to be most appropriate for this patient at this time.  All issues noted in this document were discussed and addressed.  A limited physical exam was performed with this format.  Please refer to the patient's chart for his consent to telehealth for Derek Erickson.      Date:  07/29/2022   ID:  Derek Erickson, DOB October 16, 1975, MRN [id number] The patient was identified using 2 identifiers.  Patient Location: Home Provider Location: Home Office   PCP:  Derek Erickson, Derek Pesa, NP   [place] Providers Cardiologist:  None     Evaluation Performed:  Consultation - Derek Erickson was referred by Derek Montana NP for the evaluation of OSA.  Chief Complaint:  OSA  History of Present Illness:    Derek Erickson is a 47 y.o. male who is being seen today via virtual visit for followup of OSA.  Derek Erickson is a 47 y.o. male with a hx of depression, anxiety, HLD and chest pain.  At recent [place] with Cardiology he complained of snoring and excessive daytime sleepiness with STOP BANG score of 4.  He underwent HST which showed mild OSA with an AHI of 13.7/hr and was placed on auto CPAP from 4 to 15cm H2O.    He is doing well with his PAP device and thinks that he has gotten used to it.  He tolerates the mask and feels the pressure is adequate.  Since going on PAP he feels rested in the am and has no significant daytime sleepiness.  He denies any significant mouth or nasal dryness or nasal congestion.  He does not think that he snores.   Past Medical History:  Diagnosis Date   Anxiety    Daytime somnolence 11/28/2020   Depression    Erythrocytosis 01/04/2021   Fatigue 12/19/2021   Folate deficiency 12/19/2021   Leukocytosis 01/04/2021   Other microscopic hematuria 09/14/2021   Rash of finger 07/10/2021   Past  Surgical History:  Procedure Laterality Date   TONSILLECTOMY       No outpatient medications have been marked as taking for the 07/31/22 encounter (Appointment) with Sueanne Margarita, MD.     Allergies:   Prazosin   Social History   Tobacco Use   Smoking status: Never    Passive exposure: Past   Smokeless tobacco: Current    Types: Chew  Vaping Use   Vaping Use: Never used  Substance Use Topics   Alcohol use: Yes   Drug use: Never     Family Hx: The patient's family history includes Cancer in his maternal grandfather; Dementia in his maternal grandmother; Diabetes in his mother; Heart attack in his father; Heart disease in his father; Hyperlipidemia in his father; Hypertension in his father; Stroke in his father.  ROS:   Please see the history of present illness.     All other systems reviewed and are negative.   Prior Sleep studies:   The following studies were reviewed today:  HST< PAP compliance download  Labs/Other Tests and Data Reviewed:     Recent Labs: 09/11/2021: TSH 1.550 01/27/2022: BUN 12; Creatinine, Ser 0.86; Potassium 4.1; Sodium 135 01/30/2022: Hemoglobin 15.0; Platelets 395 06/13/2022: ALT 20    Wt Readings from Last 3 Encounters:  06/25/22 211 lb (95.7 kg)  03/22/22 211 lb (95.7 kg)  02/08/22 216 lb 12.8 oz (98.3 kg)     Risk Assessment/Calculations:      STOP-Bang Score:  4  { Consider Dx Sleep Disordered Breathing or Sleep Apnea  ICD G47.33          :1}    Objective:    Vital Signs:  There were no vitals taken for this visit.  Well nourished, well developed male in no acute distress. Well appearing, alert and conversant, regular work of breathing,  good skin color  Eyes- anicteric mouth- oral mucosa is pink  neuro- grossly intact skin- no apparent rash or lesions or cyanosis ASSESSMENT & PLAN:    OSA - The patient is tolerating PAP therapy well without any problems. The PAP download performed by his DME was personally reviewed and  interpreted by me today and showed an AHI of ***/hr on *** cm H2O with ***% compliance in using more than 4 hours nightly.  The patient has been using and benefiting from PAP use and will continue to benefit from therapy.    Time:   Today, I have spent 15 minutes with the patient with telehealth technology discussing the above problems.     Medication Adjustments/Labs and Tests Ordered: Current medicines are reviewed at length with the patient today.  Concerns regarding medicines are outlined above.   Tests Ordered: No orders of the defined types were placed in this encounter.   Medication Changes: No orders of the defined types were placed in this encounter.   Follow Up:  Video visit 8 weeks  Signed, Fransico Him, MD  07/29/2022 6:06 PM    [place]

## 2022-07-31 ENCOUNTER — Encounter: Payer: Self-pay | Admitting: Cardiology

## 2022-07-31 ENCOUNTER — Ambulatory Visit: Payer: Commercial Managed Care - PPO | Attending: Cardiology | Admitting: Cardiology

## 2022-07-31 VITALS — Ht 66.0 in | Wt 215.0 lb

## 2022-07-31 DIAGNOSIS — G4733 Obstructive sleep apnea (adult) (pediatric): Secondary | ICD-10-CM

## 2022-08-05 ENCOUNTER — Ambulatory Visit (INDEPENDENT_AMBULATORY_CARE_PROVIDER_SITE_OTHER): Payer: Commercial Managed Care - PPO | Admitting: Family Medicine

## 2022-08-05 ENCOUNTER — Encounter (HOSPITAL_BASED_OUTPATIENT_CLINIC_OR_DEPARTMENT_OTHER): Payer: Self-pay | Admitting: Family Medicine

## 2022-08-05 DIAGNOSIS — E291 Testicular hypofunction: Secondary | ICD-10-CM

## 2022-08-05 DIAGNOSIS — R5383 Other fatigue: Secondary | ICD-10-CM | POA: Diagnosis not present

## 2022-08-05 DIAGNOSIS — E669 Obesity, unspecified: Secondary | ICD-10-CM | POA: Diagnosis not present

## 2022-08-05 NOTE — Progress Notes (Signed)
    Procedures performed today:    None.  Independent interpretation of notes and tests performed by another provider:   None.  Brief History, Exam, Impression, and Recommendations:    BP 118/84 (BP Location: Left Arm, Patient Position: Sitting, Cuff Size: Large)   Pulse 79   Ht 5\' 6"  (1.676 m)   Wt 213 lb 3.2 oz (96.7 kg)   SpO2 99%   BMI 34.41 kg/m   Fatigue Patient presents for evaluation of ongoing fatigue.  He feels that he has had decreased energy levels over the past few months.  He is not aware of any specific changes during that time, does report being started on statin therapy, however no new life circumstances.  He wonders if it could be potentially related to underlying mental health diagnoses such as PTSD or depression.  He has not noticed any weight changes, night sweats or other systemic symptoms.  He was diagnosed with testosterone deficiency in the past and started on testosterone replacement therapy.  He reports that after starting testosterone treatment, he did have initial improvement in regards to energy levels, however it seems as though improvement plateaued and then has been declining more recently. On exam, patient is in no acute distress, vital signs stable, cardiovascular exam with regular rate and rhythm, lungs clear to auscultation bilaterally. Discussed potential etiologies and that while symptoms could be attributed to potential exacerbation of underlying mental health issues and concerns related to these, we would need to further investigate other etiologies such as underlying metabolic changes.  As such, we will proceed with labs today including thyroid function testing, screening for diabetes, assessment of CBC and CMP.  Additional consideration is adequacy of testosterone replacement as it is possible that treatment may be subtherapeutic at this time and adjustment to medication dose would be needed.  Given that he typically administers testosterone on  Fridays, we would need to check this on a Tuesday.  Unfortunately he did miss most recent dose this past Friday.  As such we will plan to check testosterone levels next week on Tuesday with plan for patient to resume Friday dosing this week He is also in the process of having adjustments made to sleep apnea management with CPAP changes including adjusted facemask and tubing.  It is possible that with better compliance with CPAP and thus control of sleep apnea, symptoms may additionally improve.  If laboratory testing is reassuring, then may need to consider adjustment in psychiatric medications  Spent 31 minutes on this patient encounter, including preparation, chart review, face-to-face counseling with patient and coordination of care, and documentation of encounter   ___________________________________________ Akaya Pro[place], MD, ABFM, CAQSM [facility] [place]

## 2022-08-05 NOTE — Assessment & Plan Note (Addendum)
Patient presents for evaluation of ongoing fatigue.  He feels that he has had decreased energy levels over the past few months.  He is not aware of any specific changes during that time, does report being started on statin therapy, however no new life circumstances.  He wonders if it could be potentially related to underlying mental health diagnoses such as PTSD or depression.  He has not noticed any weight changes, night sweats or other systemic symptoms.  He was diagnosed with testosterone deficiency in the past and started on testosterone replacement therapy.  He reports that after starting testosterone treatment, he did have initial improvement in regards to energy levels, however it seems as though improvement plateaued and then has been declining more recently. On exam, patient is in no acute distress, vital signs stable, cardiovascular exam with regular rate and rhythm, lungs clear to auscultation bilaterally. Discussed potential etiologies and that while symptoms could be attributed to potential exacerbation of underlying mental health issues and concerns related to these, we would need to further investigate other etiologies such as underlying metabolic changes.  As such, we will proceed with labs today including thyroid function testing, screening for diabetes, assessment of CBC and CMP.  Additional consideration is adequacy of testosterone replacement as it is possible that treatment may be subtherapeutic at this time and adjustment to medication dose would be needed.  Given that he typically administers testosterone on Fridays, we would need to check this on a Tuesday.  Unfortunately he did miss most recent dose this past Friday.  As such we will plan to check testosterone levels next week on Tuesday with plan for patient to resume Friday dosing this week He is also in the process of having adjustments made to sleep apnea management with CPAP changes including adjusted facemask and tubing.  It is  possible that with better compliance with CPAP and thus control of sleep apnea, symptoms may additionally improve.  If laboratory testing is reassuring, then may need to consider adjustment in psychiatric medications

## 2022-08-06 ENCOUNTER — Ambulatory Visit (HOSPITAL_BASED_OUTPATIENT_CLINIC_OR_DEPARTMENT_OTHER): Payer: Commercial Managed Care - PPO | Admitting: Family Medicine

## 2022-08-06 LAB — CBC WITH DIFFERENTIAL/PLATELET
Basophils Absolute: 0.1 10*3/uL (ref 0.0–0.2)
Basos: 1 %
EOS (ABSOLUTE): 0.2 10*3/uL (ref 0.0–0.4)
Eos: 3 %
Hematocrit: 46.3 % (ref 37.5–51.0)
Hemoglobin: 15.3 g/dL (ref 13.0–17.7)
Immature Grans (Abs): 0 10*3/uL (ref 0.0–0.1)
Immature Granulocytes: 0 %
Lymphocytes Absolute: 2.4 10*3/uL (ref 0.7–3.1)
Lymphs: 27 %
MCH: 28 pg (ref 26.6–33.0)
MCHC: 33 g/dL (ref 31.5–35.7)
MCV: 85 fL (ref 79–97)
Monocytes Absolute: 0.8 10*3/uL (ref 0.1–0.9)
Monocytes: 9 %
Neutrophils Absolute: 5.3 10*3/uL (ref 1.4–7.0)
Neutrophils: 60 %
Platelets: 440 10*3/uL (ref 150–450)
RBC: 5.47 x10E6/uL (ref 4.14–5.80)
RDW: 15.1 % (ref 11.6–15.4)
WBC: 8.8 10*3/uL (ref 3.4–10.8)

## 2022-08-06 LAB — COMPREHENSIVE METABOLIC PANEL
ALT: 38 IU/L (ref 0–44)
AST: 26 IU/L (ref 0–40)
Albumin/Globulin Ratio: 1.9 (ref 1.2–2.2)
Albumin: 4.6 g/dL (ref 4.1–5.1)
Alkaline Phosphatase: 55 IU/L (ref 44–121)
BUN/Creatinine Ratio: 5 — ABNORMAL LOW (ref 9–20)
BUN: 5 mg/dL — ABNORMAL LOW (ref 6–24)
Bilirubin Total: 0.3 mg/dL (ref 0.0–1.2)
CO2: 24 mmol/L (ref 20–29)
Calcium: 9.8 mg/dL (ref 8.7–10.2)
Chloride: 101 mmol/L (ref 96–106)
Creatinine, Ser: 1.05 mg/dL (ref 0.76–1.27)
Globulin, Total: 2.4 g/dL (ref 1.5–4.5)
Glucose: 90 mg/dL (ref 70–99)
Potassium: 4.3 mmol/L (ref 3.5–5.2)
Sodium: 138 mmol/L (ref 134–144)
Total Protein: 7 g/dL (ref 6.0–8.5)
eGFR: 89 mL/min/{1.73_m2} (ref >59)

## 2022-08-06 LAB — HEMOGLOBIN A1C
Est. average glucose Bld gHb Est-mCnc: 111 mg/dL
Hgb A1c MFr Bld: 5.5 % (ref 4.8–5.6)

## 2022-08-06 LAB — TSH RFX ON ABNORMAL TO FREE T4: TSH: 1.39 u[IU]/mL (ref 0.450–4.500)

## 2022-08-07 ENCOUNTER — Other Ambulatory Visit (HOSPITAL_BASED_OUTPATIENT_CLINIC_OR_DEPARTMENT_OTHER): Payer: Self-pay

## 2022-08-08 ENCOUNTER — Other Ambulatory Visit (HOSPITAL_BASED_OUTPATIENT_CLINIC_OR_DEPARTMENT_OTHER): Payer: Self-pay

## 2022-08-09 ENCOUNTER — Other Ambulatory Visit: Payer: Self-pay

## 2022-08-09 ENCOUNTER — Other Ambulatory Visit (HOSPITAL_BASED_OUTPATIENT_CLINIC_OR_DEPARTMENT_OTHER): Payer: Self-pay

## 2022-08-12 ENCOUNTER — Other Ambulatory Visit (HOSPITAL_BASED_OUTPATIENT_CLINIC_OR_DEPARTMENT_OTHER): Payer: Self-pay

## 2022-08-13 ENCOUNTER — Ambulatory Visit (HOSPITAL_BASED_OUTPATIENT_CLINIC_OR_DEPARTMENT_OTHER): Payer: Commercial Managed Care - PPO

## 2022-08-13 DIAGNOSIS — E291 Testicular hypofunction: Secondary | ICD-10-CM | POA: Diagnosis not present

## 2022-08-15 ENCOUNTER — Other Ambulatory Visit (HOSPITAL_BASED_OUTPATIENT_CLINIC_OR_DEPARTMENT_OTHER): Payer: Self-pay

## 2022-08-21 LAB — TESTOSTERONE, FREE, TOTAL, SHBG
Sex Hormone Binding: 27.3 nmol/L (ref 16.5–55.9)
Testosterone, Free: 12.6 pg/mL (ref 6.8–21.5)
Testosterone: 683 ng/dL (ref 264–916)

## 2022-09-18 ENCOUNTER — Other Ambulatory Visit (HOSPITAL_BASED_OUTPATIENT_CLINIC_OR_DEPARTMENT_OTHER): Payer: Self-pay

## 2022-09-19 ENCOUNTER — Other Ambulatory Visit (HOSPITAL_BASED_OUTPATIENT_CLINIC_OR_DEPARTMENT_OTHER): Payer: Self-pay

## 2022-09-30 DIAGNOSIS — G4733 Obstructive sleep apnea (adult) (pediatric): Secondary | ICD-10-CM | POA: Diagnosis not present

## 2022-10-09 ENCOUNTER — Encounter (HOSPITAL_BASED_OUTPATIENT_CLINIC_OR_DEPARTMENT_OTHER): Payer: Self-pay

## 2022-10-31 DIAGNOSIS — G4733 Obstructive sleep apnea (adult) (pediatric): Secondary | ICD-10-CM | POA: Diagnosis not present

## 2022-11-28 ENCOUNTER — Other Ambulatory Visit (HOSPITAL_BASED_OUTPATIENT_CLINIC_OR_DEPARTMENT_OTHER): Payer: Self-pay

## 2022-11-30 DIAGNOSIS — G4733 Obstructive sleep apnea (adult) (pediatric): Secondary | ICD-10-CM | POA: Diagnosis not present

## 2022-12-02 ENCOUNTER — Other Ambulatory Visit (HOSPITAL_BASED_OUTPATIENT_CLINIC_OR_DEPARTMENT_OTHER): Payer: Self-pay

## 2022-12-02 DIAGNOSIS — E291 Testicular hypofunction: Secondary | ICD-10-CM

## 2022-12-03 ENCOUNTER — Ambulatory Visit (HOSPITAL_BASED_OUTPATIENT_CLINIC_OR_DEPARTMENT_OTHER): Payer: Commercial Managed Care - PPO | Admitting: Family Medicine

## 2022-12-03 DIAGNOSIS — E291 Testicular hypofunction: Secondary | ICD-10-CM | POA: Diagnosis not present

## 2022-12-04 LAB — TESTOSTERONE: Testosterone: 326 ng/dL (ref 264–916)

## 2023-02-05 NOTE — Progress Notes (Unsigned)
Subjective:   Derek Erickson April 18, 1976 02/06/2023  No chief complaint on file.   HPI: Derek Erickson presents today for re-assessment and management of chronic medical conditions.   The following portions of the patient's history were reviewed and updated as appropriate: past medical history, past surgical history, family history, social history, allergies, medications, and problem list.   Patient Active Problem List   Diagnosis Date Noted   Obesity, Class I, BMI 30-34.9 08/05/2022   Encounter for annual physical exam 03/27/2022   Gastroesophageal reflux disease 03/21/2022   Fatigue 12/19/2021   Testosterone deficiency in male 12/19/2021   Migraine without aura and with status migrainosus, not intractable 12/19/2021   Post-traumatic stress disorder, chronic 07/10/2021   Eczema 07/10/2021   Dyslipidemia 01/04/2021   Hypertriglyceridemia 01/04/2021   Transaminitis 01/04/2021   Bipolar II disorder (HCC) 11/28/2020   Insomnia 11/28/2020   Generalized anxiety disorder 02/27/2016   Recurrent major depressive disorder, in remission (HCC) 02/27/2016   Past Medical History:  Diagnosis Date   Anxiety    Daytime somnolence 11/28/2020   Depression    Erythrocytosis 01/04/2021   Fatigue 12/19/2021   Folate deficiency 12/19/2021   Leukocytosis 01/04/2021   Other microscopic hematuria 09/14/2021   Rash of finger 07/10/2021   Past Surgical History:  Procedure Laterality Date   TONSILLECTOMY     Family History  Problem Relation Age of Onset   Diabetes Mother    Hyperlipidemia Father    Hypertension Father    Stroke Father    Heart attack Father    Heart disease Father    Dementia Maternal Grandmother    Cancer Maternal Grandfather    Outpatient Medications Prior to Visit  Medication Sig Dispense Refill   ALPRAZolam (XANAX) 0.25 MG tablet Take 1 tablet (0.25 mg total) by mouth 2 (two) times daily as needed for anxiety. 30 tablet 2   ARIPiprazole (ABILIFY) 15 MG tablet Take 1  tablet (15 mg total) by mouth daily. 90 tablet 3   aspirin EC 81 MG tablet Take 1 tablet (81 mg total) by mouth daily. Swallow whole. 90 tablet 3   citalopram (CELEXA) 40 MG tablet Take 1 tablet (40 mg total) by mouth daily. 90 tablet 3   guaiFENesin (MUCINEX) 600 MG 12 hr tablet Take 2 tablets (1,200 mg total) by mouth 2 (two) times daily as needed. 30 tablet 1   NEEDLE, DISP, 22 G 22G X 1-1/2" MISC For IM testosterone injection. Once every 14 days. 25 each 0   ondansetron (ZOFRAN) 8 MG tablet Take 1 tablet (8 mg total) by mouth every 8 (eight) hours as needed for nausea or vomiting. 30 tablet 5   rosuvastatin (CRESTOR) 20 MG tablet Take 1 tablet (20 mg total) by mouth daily. 90 tablet 3   Semaglutide-Weight Management 1.7 MG/0.75ML SOAJ Inject 1.7 mg into the skin once a week. 9 mL 2   SYRINGE-NEEDLE, DISP, 3 ML (B-D 3CC LUER-LOK SYR 22GX1") 22G X 1" 3 ML MISC Use to inject testosterone 25 each 0   testosterone cypionate (DEPOTESTOSTERONE CYPIONATE) 200 MG/ML injection Inject 0.75 mLs (150 mg total) into the muscle every 7 (seven) days. 3 mL 5   traZODone (DESYREL) 100 MG tablet Take 1 tablet (100 mg total) by mouth at bedtime. 90 tablet 3   verapamil (CALAN) 40 MG tablet Take 1 tablet (40 mg total) by mouth 3 (three) times daily. 90 tablet 3   No facility-administered medications prior to visit.   Allergies  Allergen  Reactions   Prazosin Other (See Comments)    LOC and Dizziness      ROS: A complete ROS was performed with pertinent positives/negatives noted in the HPI. The remainder of the ROS are negative.    Objective:   There were no vitals filed for this visit.  Physical Exam          GENERAL: Well-appearing, in NAD. Well nourished.  SKIN: Pink, warm and dry. No rash, lesion, ulceration, or ecchymoses.  Head: Normocephalic. NECK: Trachea midline. Full ROM w/o pain or tenderness. No lymphadenopathy.  EARS: Tympanic membranes are intact, translucent without bulging and  without drainage. Appropriate landmarks visualized.  EYES: Conjunctiva clear without exudates. EOMI, PERRL, no drainage present.  NOSE: Septum midline w/o deformity. Nares patent, mucosa pink and non-inflamed w/o drainage. No sinus tenderness.  THROAT: Uvula midline. Oropharynx clear. Tonsils non-inflamed without exudate. Mucous membranes pink and moist.  RESPIRATORY: Chest wall symmetrical. Respirations even and non-labored. Breath sounds clear to auscultation bilaterally.  CARDIAC: S1, S2 present, regular rate and rhythm without murmur or gallops. Peripheral pulses 2+ bilaterally.  MSK: Muscle tone and strength appropriate for age. Joints w/o tenderness, redness, or swelling.  EXTREMITIES: Without clubbing, cyanosis, or edema.  NEUROLOGIC: No motor or sensory deficits. Steady, even gait. C2-C12 intact.  PSYCH/MENTAL STATUS: Alert, oriented x 3. Cooperative, appropriate mood and affect.   Health Maintenance Due  Topic Date Due   COVID-19 Vaccine (3 - 2023-24 season) 03/29/2022   Colonoscopy  Never done    No results found for any visits on 02/06/23.  The 10-year ASCVD risk score (Arnett DK, et al., 2019) is: 4.7%     Assessment & Plan:  *** There are no diagnoses linked to this encounter.  No orders of the defined types were placed in this encounter.  Lab Orders  No laboratory test(s) ordered today   No images are attached to the encounter or orders placed in the encounter.  No follow-ups on file.    Patient to reach out to office if new, worrisome, or unresolved symptoms arise or if no improvement in patient's condition. Patient verbalized understanding and is agreeable to treatment plan. All questions answered to patient's satisfaction.   Of note, portions of this note may have been created with voice recognition software [employer]ic[employer], medical). While this note has been edited for accuracy, occasional wrong-word or 'sound-a-like' substitutions may have occurred due to the inherent  limitations of voice recognition software.  Yolanda Manges, FNP

## 2023-02-06 ENCOUNTER — Other Ambulatory Visit (HOSPITAL_BASED_OUTPATIENT_CLINIC_OR_DEPARTMENT_OTHER): Payer: Self-pay

## 2023-02-06 ENCOUNTER — Encounter (HOSPITAL_BASED_OUTPATIENT_CLINIC_OR_DEPARTMENT_OTHER): Payer: Self-pay | Admitting: Family Medicine

## 2023-02-06 ENCOUNTER — Ambulatory Visit (HOSPITAL_BASED_OUTPATIENT_CLINIC_OR_DEPARTMENT_OTHER): Payer: Managed Care, Other (non HMO) | Admitting: Family Medicine

## 2023-02-06 VITALS — BP 124/78 | HR 72 | Ht 66.0 in | Wt 215.0 lb

## 2023-02-06 DIAGNOSIS — K219 Gastro-esophageal reflux disease without esophagitis: Secondary | ICD-10-CM

## 2023-02-06 DIAGNOSIS — F411 Generalized anxiety disorder: Secondary | ICD-10-CM | POA: Diagnosis not present

## 2023-02-06 DIAGNOSIS — R5382 Chronic fatigue, unspecified: Secondary | ICD-10-CM

## 2023-02-06 DIAGNOSIS — F3340 Major depressive disorder, recurrent, in remission, unspecified: Secondary | ICD-10-CM

## 2023-02-06 IMAGING — DX DG CHEST 2V
2 series · 2 of 2 positions shown · non-contrast
Comparison: None.

CLINICAL DATA: Chest pain

EXAM:
CHEST - 2 VIEW

[chest pa]
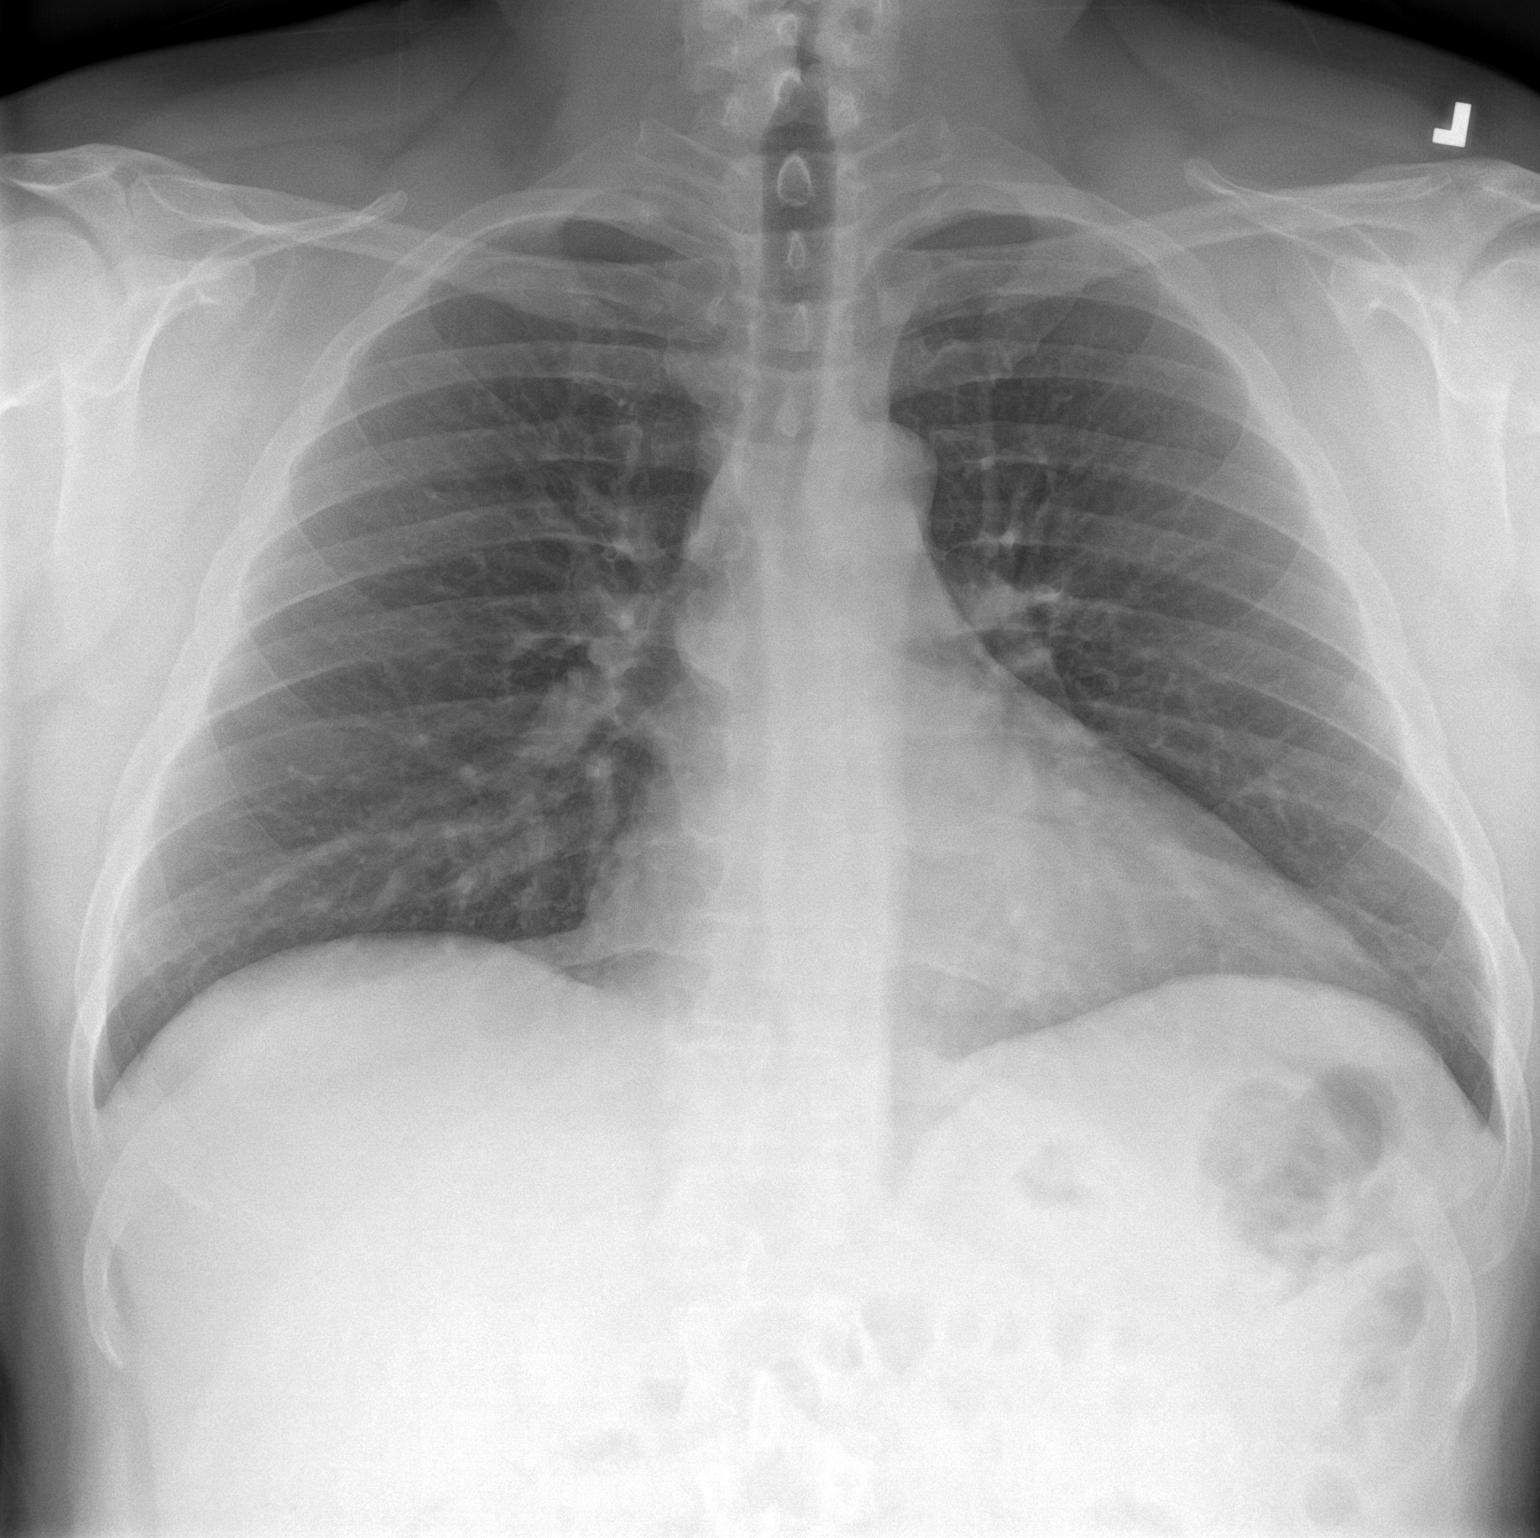

[chest lat]
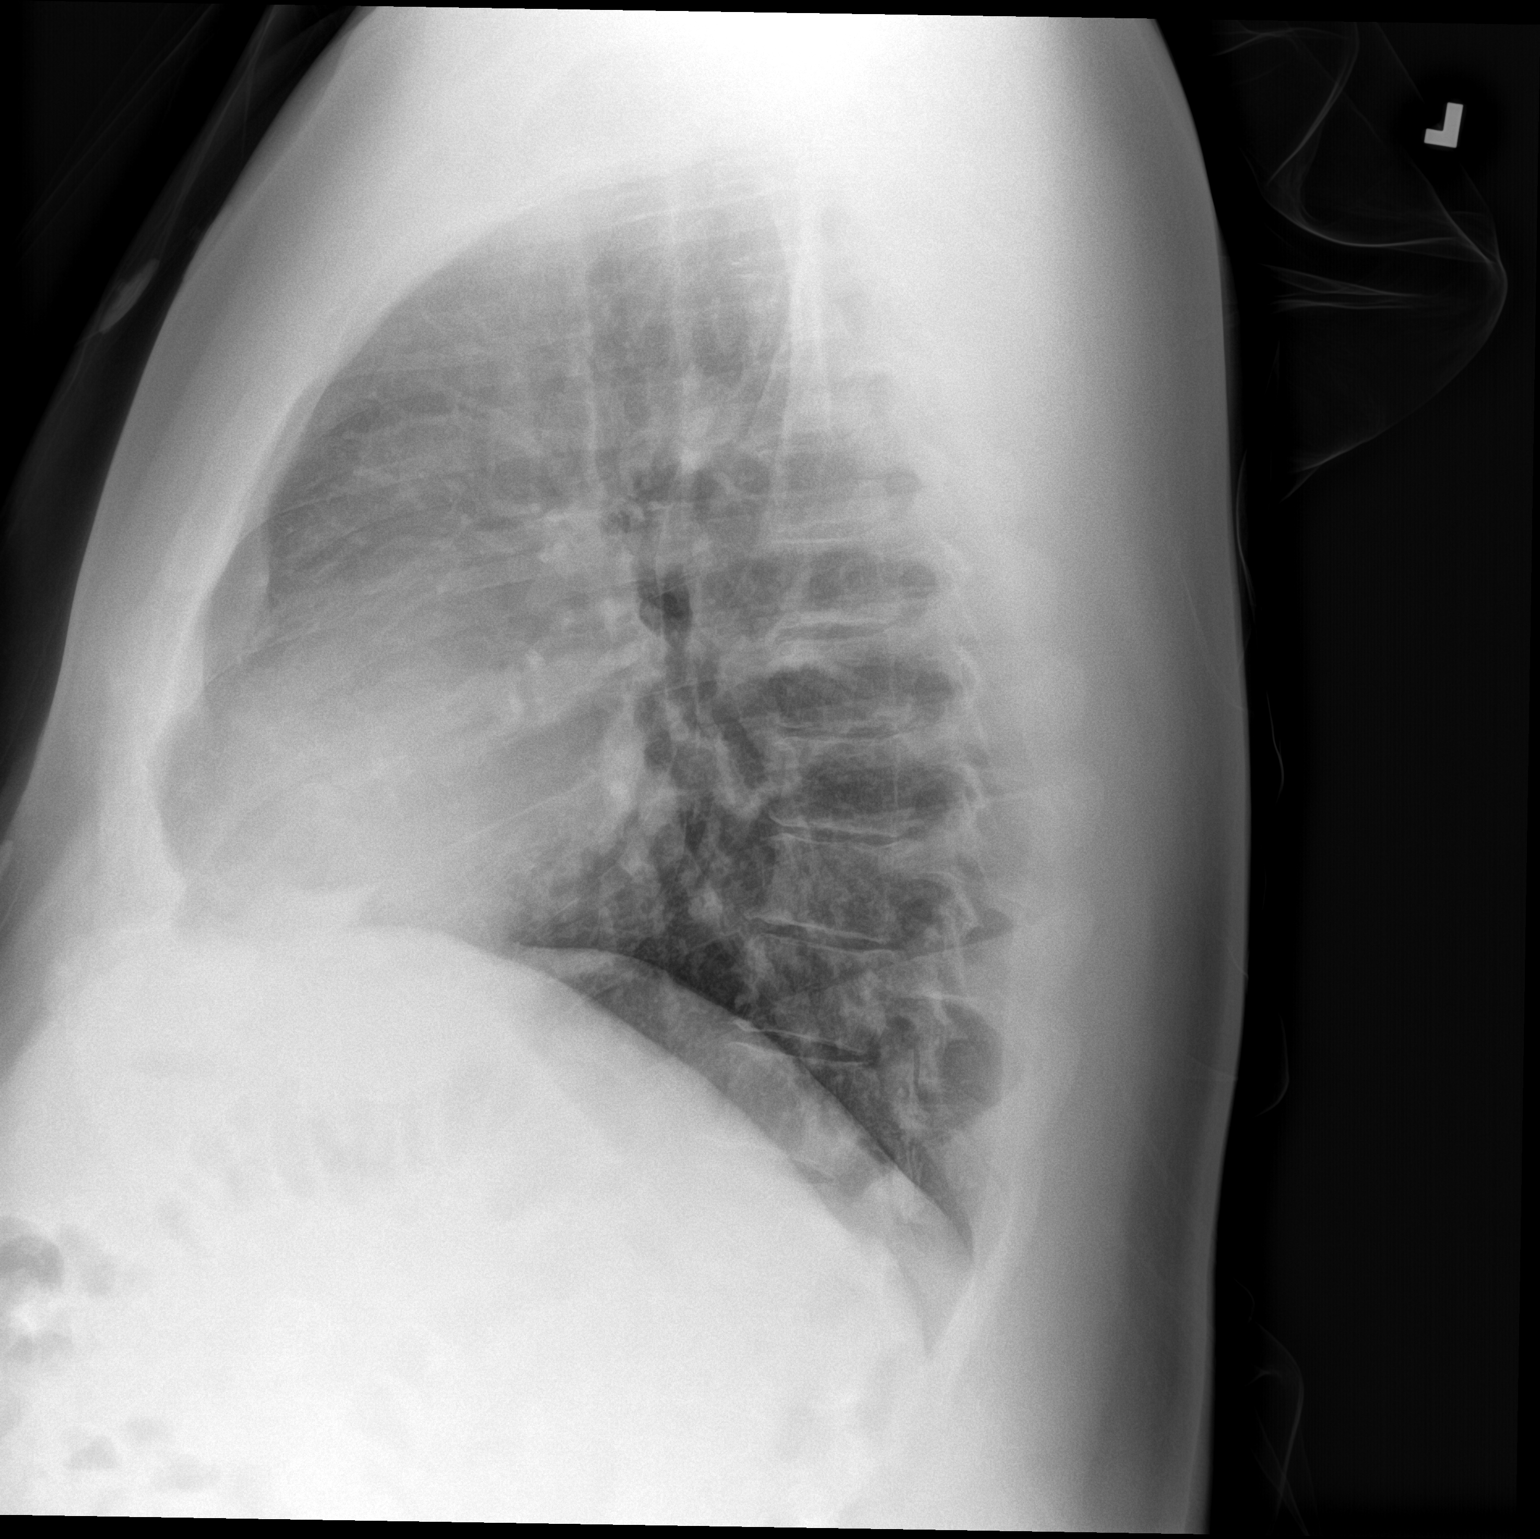

[2 of 2 positions shown; findings below may reference images not displayed]

FINDINGS: The heart size and mediastinal contours are within normal limits.
Both lungs are clear. The visualized skeletal structures are
unremarkable.
IMPRESSION: No active cardiopulmonary disease.

## 2023-02-06 MED ORDER — OMEPRAZOLE 40 MG PO CPDR
40.0000 mg | DELAYED_RELEASE_CAPSULE | Freq: Every day | ORAL | 3 refills | Status: DC
  Filled 2023-02-06: qty 30, 30d supply, fill #0
  Filled 2023-03-20: qty 30, 30d supply, fill #1
  Filled 2023-05-05 – 2023-05-15 (×2): qty 30, 30d supply, fill #2
  Filled 2023-09-23: qty 30, 30d supply, fill #3

## 2023-02-06 NOTE — Patient Instructions (Addendum)
Please get labs drawn today.   Counseling and Mental Health Resources   [facility]  - For Women and Girls only - Cost based upon sliding scale of income - Financial Aid available  [phone] [address] [place], [place] [address] [facility]  - Mental Health, Substance Abuse Treatment - IV Ketamine, Hydration and Weight Loss Programs - Center for Treatment for Resistant Depression and Suicidal Ideation  [address] [place], [place] [address]  [phone] [email or web address]   [facility]  - Individual and Family Counseling - Assessments and Therapy for Learning Disabilities, ADHD, Autism Spectrum Disorder, Processing Deficits  [phone] [address] [place], [place] [address]  [facility]  [address] [place] [address] [place], [place] [address]  [phone]  [facility]  - Individual, Family, Play and Group Therapy - In person and telehealth sessions available  Phone: [phone] Email: [email or web address]  [place] Location:   [address] [place] [address] [place], [place] [address]   [place] Location:   [address] [place], [place] [address] [facility]  [address] (Inside old [address] [place], [place] [address]  [phone]  [facility], [facility]  Adult, Adolescent and [facility]  [address], [place] [address]  Text:  [phone]   Call:  [phone] Email: [email or web address] Su Ley MA Clinical Psychology  [address] [place] [address]  [phone]  [facility]  - Individual, Group Therapy - Day Programs, Wellness Coaching - Staff Programming, Workshops  [address], [place], [place] [address]  [phone]   [facility] - [place] [facility]    [facility]  - Indivudual Counseling, Enneagram Therapy [address] [place], [place] [address]  [phone] [facility]  - Children, Adolescent, Adult and Family Therapy  [place] Location [phone]  [address] [place], [place] [address]   [place] Location [phone]  [address] [place], [place] [address]    [facility]  Counseling offered by Art therapist Students  - Majority of Patients qualify for financial assistance   [address] [place], [place] [address]  [phone]   [facility]  -Services at "less than a basic fee" sponsored by [facility]  [address] [place], [place] [address]  [phone]

## 2023-02-08 LAB — VITAMIN D 25 HYDROXY (VIT D DEFICIENCY, FRACTURES): Vit D, 25-Hydroxy: 27.2 ng/mL — ABNORMAL LOW (ref 30.0–100.0)

## 2023-02-08 LAB — TESTOSTERONE: Testosterone: 173 ng/dL — ABNORMAL LOW (ref 264–916)

## 2023-02-08 LAB — VITAMIN B12: Vitamin B-12: 566 pg/mL (ref 232–1245)

## 2023-02-08 LAB — PSA TOTAL (REFLEX TO FREE): Prostate Specific Ag, Serum: 0.7 ng/mL (ref 0.0–4.0)

## 2023-02-10 ENCOUNTER — Other Ambulatory Visit (HOSPITAL_BASED_OUTPATIENT_CLINIC_OR_DEPARTMENT_OTHER): Payer: Self-pay | Admitting: Family Medicine

## 2023-02-10 DIAGNOSIS — R5382 Chronic fatigue, unspecified: Secondary | ICD-10-CM

## 2023-02-10 NOTE — Progress Notes (Signed)
Your first testosterone level is low. I have placed the order for one more draw so you can get this at any time. Your Vitamin D is also low. I would recommend taking an over the counter supplement of D3 approx. [phone] units daily and we can recheck this in 4-6 months.

## 2023-02-11 LAB — TESTOSTERONE: Testosterone: 286 ng/dL (ref 264–916)

## 2023-02-13 ENCOUNTER — Telehealth (HOSPITAL_BASED_OUTPATIENT_CLINIC_OR_DEPARTMENT_OTHER): Payer: Self-pay | Admitting: *Deleted

## 2023-02-13 NOTE — Telephone Encounter (Signed)
Called to offer colon cancer screening. Pt will call the office back he was busy at the moment

## 2023-03-21 ENCOUNTER — Ambulatory Visit (HOSPITAL_BASED_OUTPATIENT_CLINIC_OR_DEPARTMENT_OTHER): Payer: Managed Care, Other (non HMO) | Admitting: Family

## 2023-03-21 ENCOUNTER — Encounter (HOSPITAL_BASED_OUTPATIENT_CLINIC_OR_DEPARTMENT_OTHER): Payer: Self-pay | Admitting: Family

## 2023-03-21 ENCOUNTER — Other Ambulatory Visit (HOSPITAL_BASED_OUTPATIENT_CLINIC_OR_DEPARTMENT_OTHER): Payer: Self-pay

## 2023-03-21 VITALS — BP 114/82 | HR 91 | Ht 66.0 in | Wt 216.0 lb

## 2023-03-21 DIAGNOSIS — G4733 Obstructive sleep apnea (adult) (pediatric): Secondary | ICD-10-CM | POA: Diagnosis not present

## 2023-03-21 DIAGNOSIS — I25118 Atherosclerotic heart disease of native coronary artery with other forms of angina pectoris: Secondary | ICD-10-CM

## 2023-03-21 DIAGNOSIS — E785 Hyperlipidemia, unspecified: Secondary | ICD-10-CM | POA: Diagnosis not present

## 2023-03-21 MED ORDER — ROSUVASTATIN CALCIUM 20 MG PO TABS
20.0000 mg | ORAL_TABLET | Freq: Every day | ORAL | 3 refills | Status: AC
Start: 2023-03-21 — End: 2024-03-15
  Filled 2023-05-05: qty 30, 30d supply, fill #0
  Filled 2023-09-23: qty 90, 90d supply, fill #0
  Filled 2023-11-11: qty 90, 90d supply, fill #1

## 2023-03-21 NOTE — Patient Instructions (Addendum)
Medication Instructions:   Per primary care: Recommend over the counter supplement of D3 approx. [phone] units daily   *If you need a refill on your cardiac medications before your next appointment, please call your pharmacy*  Lab Work: We will keep an eye out for cholesterol labs from your upcoming visit with primary care.   Testing/Procedures: Your EKG today looked great!  Follow-Up: At [facility], you and your health needs are our priority.  As part of our continuing mission to provide you with exceptional heart care, we have created designated Provider Care Teams.  These Care Teams include your primary Cardiologist (physician) and Advanced Practice Providers (APPs -  Physician Assistants and Nurse Practitioners) who all work together to provide you with the care you need, when you need it.  We recommend signing up for the patient portal called "MyChart".  Sign up information is provided on this After Visit Summary.  MyChart is used to connect with patients for Virtual Visits (Telemedicine).  Patients are able to view lab/test results, encounter notes, upcoming appointments, etc.  Non-urgent messages can be sent to your provider as well.   To learn more about what you can do with MyChart, go to [email or web address].    Your next appointment:   1 year(s)  Provider:   Gillian Shields, NP    Other Instructions  Heart Healthy Diet Recommendations: A low-salt diet is recommended. Meats should be grilled, baked, or boiled. Avoid fried foods. Focus on lean protein sources like fish or chicken with vegetables and fruits. The American Heart Association is a Chief Technology Officer!  American Heart Association Diet and Lifeystyle Recommendations   Exercise recommendations: The American Heart Association recommends 150 minutes of moderate intensity exercise weekly. Try 30 minutes of moderate intensity exercise 4-5 times per week. This could include walking, jogging, or swimming.  Right  Start Program at [employer]  Sessions include Structured exercise sessions 2 group sessions per week for 9 weeks Monitored by fitness app 20 to 40-minute sessions Post program complications such as a neck step.  It is free for [facility] Members or $99 for non-members. Financial assistance is available.  No referral required.  Please call [phone], visit [facility] in person, or register online at [email or web address]

## 2023-03-21 NOTE — Progress Notes (Signed)
Cardiology Office Note:  .   Date:  03/21/2023  ID:  Derek Erickson, DOB [birth year], MRN [id number] PCP: de Peru, Derek J, MD  [facility] Providers Cardiology APP: Alver Sorrow, NP  Cardiologist:  None    History of Present Illness: Derek Kitchen   Sheik Erickson is a 47 y.o. male nonobstructive coronary artery disease, hyperlipidemia, migraine, depression, anxiety, OSA on CPAP.  Established with cardiology 02/01/2022 after ED visit with persistent chest pain.  Subsequent cardiac CTA 02/25/2022 coronary calcium score of 1.22 placing him in the 76 percentile for age/sex matched control but overall minimal nonobstructive CAD with 0-24% stenosis.  ED visit 01/26/23 with chest pain after eating seafood the night prior and taking Alka-Seltzer had vomiting episode.  Given Maalox and Zofran.  EKG nonacute, troponin X2 normal, CXR no acute cardiopulmonary process.  Presents today for follow-up intermittently. 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 Most recent lipid panel 05/2022 total cholesterol 138, triglycerides 132, HDL 38, LDL 76.  This was much improved from previous lipids a year prior not on medical therapy with total cholesterol 237, triglycerides 279, LDL 149. Notes has made dietary changes but not exercising regularly. Discussed tactics to be more active. Notes fatigue in the setting of stress. Also notes not wearing CPAP regularly. PCP recommended vitamin D supplement which he has not yet started.  ROS: Please see the history of present illness.    All other systems reviewed and are negative.   Studies Reviewed: Derek Kitchen   EKG Interpretation Date/Time:  Friday March 21 2023 08:13:59 EDT Ventricular Rate:  91 PR Interval:  122 QRS Duration:  94 QT Interval:  366 QTC Calculation: 450 R Axis:   65  Text Interpretation: Normal sinus rhythm Normal ECG Confirmed by Gillian Shields ([id number]) on 03/21/2023 8:20:11 AM    Cardiac Studies & Procedures          CT SCANS  CT  CORONARY MORPH W/CTA COR W/SCORE 02/25/2022  Addendum 02/25/2022  4:32 PM ADDENDUM REPORT: 02/25/2022 16:30  EXAM: OVER-READ INTERPRETATION  CT CHEST  The following report is an over-read performed by radiologist Dr. Lesia Hausen [facility] on 02/25/2022. This over-read does not include interpretation of cardiac or coronary anatomy or pathology. The coronary CTA interpretation by the cardiologist is attached.  COMPARISON:  None.  FINDINGS: Cardiovascular: Normal heart size. No significant pericardial effusion/thickening. Great vessels are normal in course and caliber. No central pulmonary emboli.  Mediastinum/Nodes: Unremarkable esophagus. No pathologically enlarged mediastinal or hilar lymph nodes.  Lungs/Pleura: No pneumothorax. No pleural effusion. No acute consolidative airspace disease, lung masses or significant pulmonary nodules, noting limited evaluation of the lower lobes due to hypoventilatory changes.  Upper abdomen: No acute abnormality.  Musculoskeletal: No aggressive appearing focal osseous lesions. Mild-to-moderate thoracic spondylosis.  IMPRESSION: No significant extracardiac findings.   Electronically Signed By: Delbert Phenix M.D. On: 02/25/2022 16:30  Narrative CLINICAL DATA:  This is a 47 year old male with anginal symptoms.  EXAM: Cardiac/Coronary  CTA  TECHNIQUE: The patient was scanned on a [employer].  FINDINGS: A 100 kV prospective scan was triggered in the descending thoracic aorta at 111 HU's. Axial non-contrast 3 mm slices were carried out through the heart. The data set was analyzed on a dedicated work station and scored using the Agatson method. Gantry rotation speed was 250 msecs and collimation was .6 mm. No beta blockade and 0.8 mg of sl NTG was given. The 3D data set  was reconstructed in 5% intervals of the 67-82 % of the R-R cycle. Diastolic phases were analyzed on a dedicated work station using MPR,  MIP and VRT modes. The patient received 80 cc of contrast.  Aorta: Normal size.  No calcifications.  No dissection.  Aortic Valve:  Trileaflet.  No calcifications.  Coronary Arteries:  Normal coronary origin.  Right dominance.  RCA is a large dominant artery that gives rise to PDA and PLA. There is minimal (<24%) calcified plaque in the proximal RCA. The mid to distal RCA with no plaques.  Left main is a large artery that gives rise to LAD and LCX arteries.  LAD is a large vessel that has no plaque.  LCX is a non-dominant artery that gives rise to one large OM1 branch. There is no plaque.  Coronary Calcium Score:  Left main: 0  Left anterior descending artery: 0  Left circumflex artery: 0  Right coronary artery: 1.22  Total: 1.22  Percentile: 76  Other findings:  Normal pulmonary vein drainage into the left atrium.  Normal left atrial appendage without a thrombus.  Normal size of the pulmonary artery.  IMPRESSION: 1. Coronary calcium score of 1.22. This was 64 percentile for age and sex matched control.  2. Normal coronary origin with right dominance.  3. CAD-RADS 1. Minimal non-obstructive CAD (0-24%). Consider non-atherosclerotic causes of chest pain. Consider preventive therapy and risk factor modification.  The noncardiac portion of this study will be interpreted in separate report by the radiologist.  Electronically Signed: By: Thomasene Ripple D.O. On: 02/25/2022 15:55          Risk Assessment/Calculations:         STOP-Bang Score:         Physical Exam:   VS:  BP 114/82 (BP Location: Left Arm, Patient Position: Sitting, Cuff Size: Large)   Pulse 91   Ht 5\' 6"  (1.676 m)   Wt 216 lb (98 kg)   BMI 34.86 kg/m    Wt Readings from Last 3 Encounters:  03/21/23 216 lb (98 kg)  02/06/23 215 lb (97.5 kg)  08/05/22 213 lb 3.2 oz (96.7 kg)    GEN: Well nourished, well developed in no acute distress NECK: No JVD; No carotid bruits CARDIAC: RRR, no  murmurs, rubs, gallops RESPIRATORY:  Clear to auscultation without rales, wheezing or rhonchi  ABDOMEN: Soft, non-tender, non-distended EXTREMITIES:  No edema; No deformity   ASSESSMENT AND PLAN: .    Nonobstructive CAD - Stable with no anginal symptoms. No indication for ischemic evaluation.  GDMT aspirin, rosuvastatin. Recommend aiming for 150 minutes of moderate intensity activity per week and following a heart healthy diet.    HLD, LDL goal <70 - Continue Rosuvastatin 20mg  daily. Has upcoming annual physical and lipids will be checked at that time.  If LDL not at goal plan to increase to rosuvastatin 40 mg daily.  Fatigue - Likely multifactorial  not wearing CPAP, recent work stressors, vitamin D deficiency. Encouraged to start vitamin D supplement   OSA - CPAP compliance encouraged. Encouraged to resume using.        Dispo: follow up in 1 year  Signed, Alver Sorrow, NP

## 2023-03-24 ENCOUNTER — Encounter (HOSPITAL_BASED_OUTPATIENT_CLINIC_OR_DEPARTMENT_OTHER): Payer: Self-pay | Admitting: Family Medicine

## 2023-03-24 ENCOUNTER — Ambulatory Visit (INDEPENDENT_AMBULATORY_CARE_PROVIDER_SITE_OTHER): Payer: Managed Care, Other (non HMO) | Admitting: Family Medicine

## 2023-03-24 VITALS — BP 145/99 | HR 85 | Ht 66.0 in | Wt 215.0 lb

## 2023-03-24 DIAGNOSIS — Z0000 Encounter for general adult medical examination without abnormal findings: Secondary | ICD-10-CM

## 2023-03-24 DIAGNOSIS — Z23 Encounter for immunization: Secondary | ICD-10-CM | POA: Diagnosis not present

## 2023-03-24 NOTE — Assessment & Plan Note (Signed)
Routine HCM labs ordered. HCM reviewed/discussed. Anticipatory guidance regarding healthy weight, lifestyle and choices given. Recommend healthy diet.  Recommend approximately 150 minutes/week of moderate intensity exercise Recommend regular dental and vision exams Always use seatbelt/lap and shoulder restraints Recommend using smoke alarms and checking batteries at least twice a year Recommend using sunscreen when outside Discussed colon cancer screening recommendations, options.  Patient recalls colonoscopy through [place], thinks about 5 years ago, reports being normal Discussed tetanus immunization recommendations, patient is UTD

## 2023-03-24 NOTE — Addendum Note (Signed)
Addended by: Katharine Look on: 03/24/2023 10:02 AM   Modules accepted: Orders

## 2023-03-24 NOTE — Progress Notes (Signed)
Subjective:    CC: Annual Physical Exam  HPI: Derek Erickson is a 47 y.o. presenting for annual physical  I reviewed the past medical history, family history, social history, surgical history, and allergies today and no changes were needed.  Please see the problem list section below in epic for further details.  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   Leukocytosis 01/04/2021   Other microscopic hematuria 09/14/2021   Rash of finger 07/10/2021   Past Surgical History: Past Surgical History:  Procedure Laterality Date   TONSILLECTOMY     Social History: Social History   Socioeconomic History   Marital status: Married    Spouse name: Not on file   Number of children: Not on file   Years of education: Not on file   Highest education level: Not on file  Occupational History   Not on file  Tobacco Use   Smoking status: Never    Passive exposure: Past   Smokeless tobacco: Current    Types: Chew  Vaping Use   Vaping status: Never Used  Substance and Sexual Activity   Alcohol use: Yes   Drug use: Never   Sexual activity: Yes    Birth control/protection: None  Other Topics Concern   Not on file  Social History Narrative   Not on file   Social Determinants of Health   Financial Resource Strain: Not on file  Food Insecurity: Not on file  Transportation Needs: Not on file  Physical Activity: Not on file  Stress: Not on file  Social Connections: Unknown (11/26/2021)   [facility]   Social Network    Social Network: Not on file   Family History: Family History  Problem Relation Age of Onset   Diabetes Mother    Hyperlipidemia Father    Hypertension Father    Stroke Father    Heart attack Father    Heart disease Father    Dementia Maternal Grandmother    Cancer Maternal Grandfather     Allergies: Allergies  Allergen Reactions   Prazosin Other (See Comments)    LOC and Dizziness    Medications: See med rec.  Review of Systems: No headache, visual changes, nausea, vomiting, diarrhea, constipation, dizziness, abdominal pain, skin rash, fevers, chills, night sweats, swollen lymph nodes, weight loss, chest pain, body aches, joint swelling, muscle aches, shortness of breath, mood changes, visual or auditory hallucinations.  Objective:    BP (!) 145/99   Pulse 85   Ht 5\' 6"  (1.676 m)   Wt 215 lb (97.5 kg)   SpO2 98%   BMI 34.70 kg/m   General: Well Developed, well nourished, and in no acute distress.  Neuro: Alert and oriented x3, extra-ocular muscles intact, sensation grossly intact. Cranial nerves II through XII are intact, motor, sensory, and coordinative functions are all intact. HEENT: Normocephalic, atraumatic, pupils equal round reactive to light, neck supple, no masses, no lymphadenopathy, thyroid nonpalpable. Oropharynx, nasopharynx, external ear canals are unremarkable. Skin: Warm and dry, no rashes noted.  Cardiac: Regular rate and rhythm, no murmurs rubs or gallops.  Respiratory: Clear to auscultation bilaterally. Not using accessory muscles, speaking in full sentences.  Abdominal: Soft, nontender, nondistended, positive bowel sounds, no masses, no organomegaly.  Musculoskeletal: Shoulder, elbow, wrist, hip, knee, ankle stable, and with full range of motion.  Impression and Recommendations:    Wellness examination Assessment & Plan: Routine HCM labs ordered. HCM reviewed/discussed. Anticipatory guidance regarding healthy weight, lifestyle and choices given. Recommend healthy diet.  Recommend approximately 150 minutes/week of moderate intensity exercise Recommend regular dental and vision exams Always use seatbelt/lap and shoulder restraints Recommend using smoke alarms and checking batteries at least twice a year Recommend using sunscreen when  outside Discussed colon cancer screening recommendations, options.  Patient recalls colonoscopy through [place], thinks about 5 years ago, reports being normal Discussed tetanus immunization recommendations, patient is UTD  Orders: -     CBC with Differential/Platelet; Future -     Comprehensive metabolic panel; Future -     Hemoglobin A1c; Future -     Lipid panel; Future -     TSH Rfx on Abnormal to Free T4; Future  Need for influenza vaccination  Patient has Testosterone replacement therapy in the past, however not currently utilizing.  He did have recent labs that did show low testosterone reading on 1 lab and borderline low, however still within normal range, another.  We reviewed general considerations.  He does have some symptoms of fatigue, also with some depressive symptoms, decreased libido.  He has historically dealt with these issues.  He also has underlying PTSD and feels that current symptoms may be more likely related to this.  He works closely with the [place] and did have recent medication change related to PTSD.  He will be following up with them and will bit over 1 month.  We discussed considerations today related to symptoms, consideration for testosterone replacement therapy.  For now, he will continue to focus on treatment of underlying PTSD and monitoring symptoms as he is not aware of significant impact of being on testosterone replacement therapy in the past  Return in about 1 year (around 03/23/2024) for CPE.   ___________________________________________ Jayleah Garbers de [place], MD, ABFM, CAQSM Primary Care and Sports Medicine [facility]

## 2023-05-05 ENCOUNTER — Other Ambulatory Visit (HOSPITAL_BASED_OUTPATIENT_CLINIC_OR_DEPARTMENT_OTHER): Payer: Self-pay

## 2023-05-15 ENCOUNTER — Other Ambulatory Visit (HOSPITAL_BASED_OUTPATIENT_CLINIC_OR_DEPARTMENT_OTHER): Payer: Self-pay

## 2023-05-16 ENCOUNTER — Other Ambulatory Visit (HOSPITAL_BASED_OUTPATIENT_CLINIC_OR_DEPARTMENT_OTHER): Payer: Self-pay

## 2023-06-19 ENCOUNTER — Encounter (HOSPITAL_BASED_OUTPATIENT_CLINIC_OR_DEPARTMENT_OTHER): Payer: Self-pay | Admitting: Family Medicine

## 2023-07-11 IMAGING — DX DG ABDOMEN 1V
1 series · 1 of 1 positions shown · non-contrast
Comparison: Ultrasound 12/04/2020

CLINICAL DATA: flank pain, decreased UOP

EXAM:
ABDOMEN - 1 VIEW

[abdomen kub]
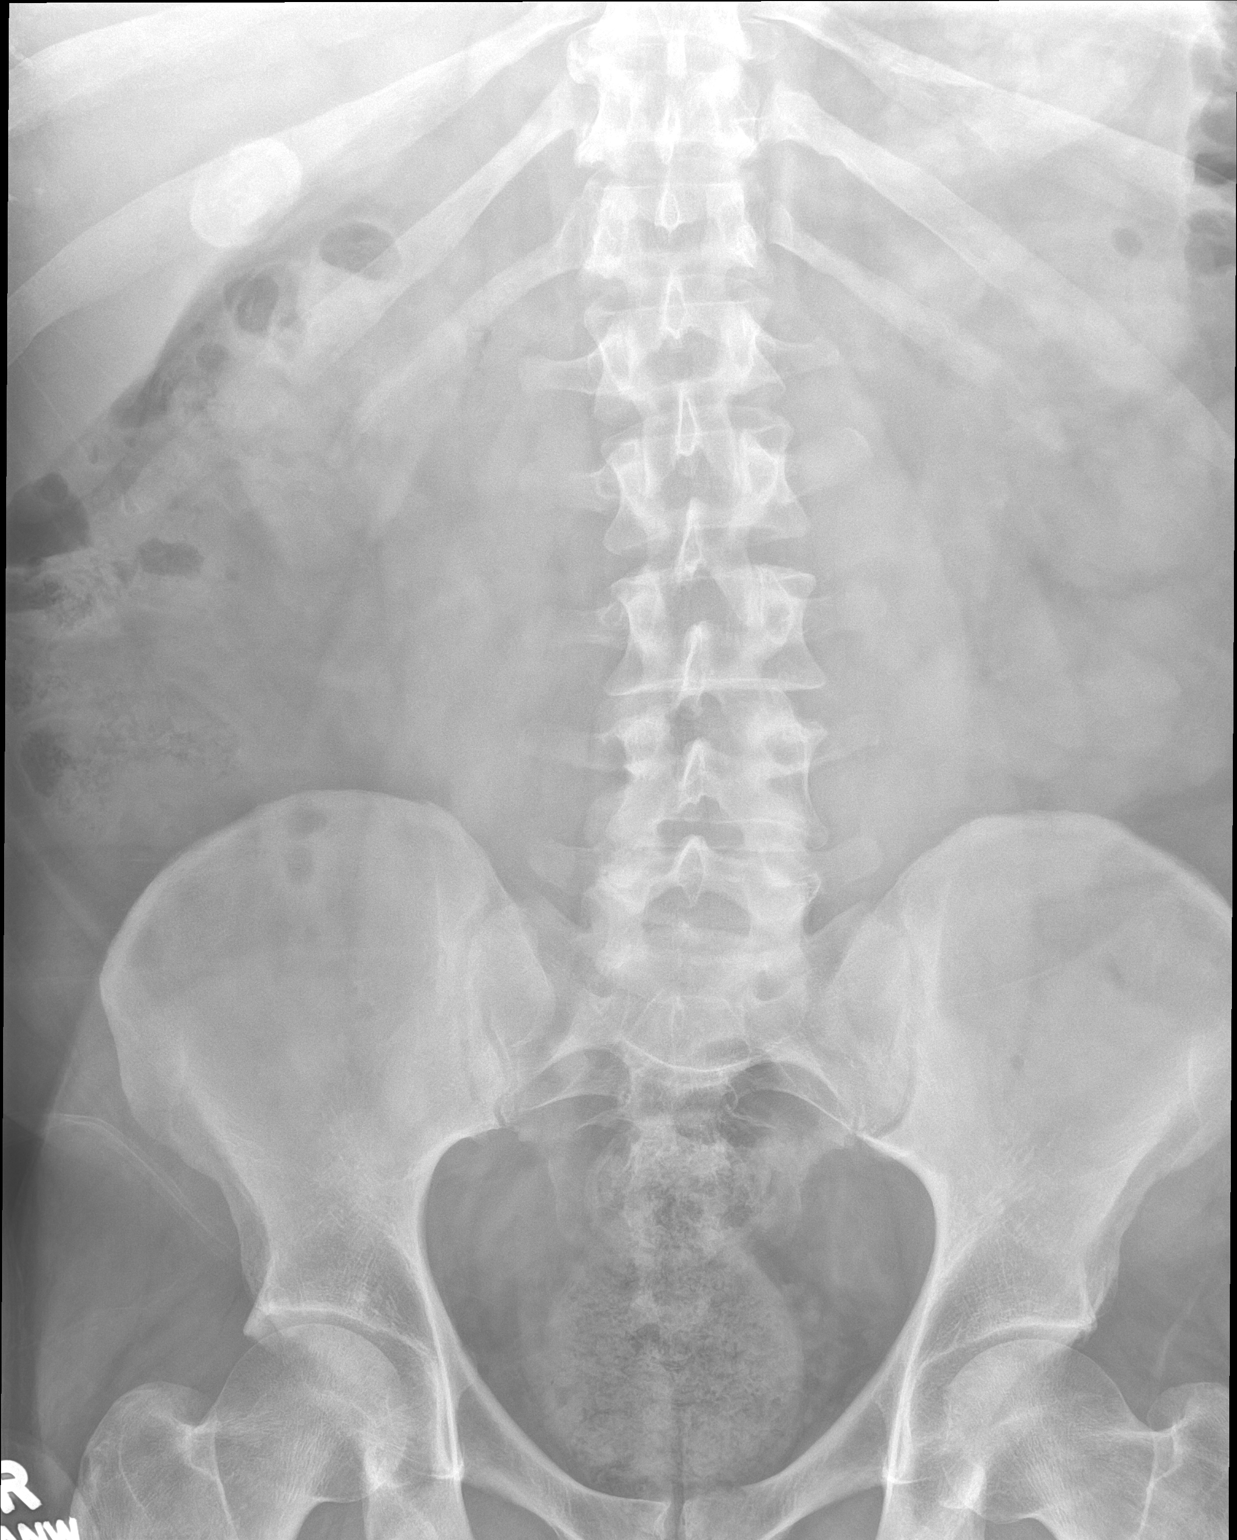

[1 of 1 positions shown; findings below may reference images not displayed]

FINDINGS: No evidence of bowel obstruction. There are no radiopaque calculi
overlying the kidneys or course of the ureters. There is a moderate
rectal stool burden. There is a 3.2 cm calcification overlying the
right upper quadrant consistent with a gallstone as seen on prior
ultrasound.
IMPRESSION: No radiographically evident nephroureterolithiasis.

No evidence of bowel obstruction.  Moderate rectal stool burden.

3.2 cm calcification overlying the right upper quadrant consistent
with a gallstone as seen on prior ultrasound.

## 2023-09-23 ENCOUNTER — Ambulatory Visit (INDEPENDENT_AMBULATORY_CARE_PROVIDER_SITE_OTHER): Payer: Managed Care, Other (non HMO) | Admitting: Family Medicine

## 2023-09-23 ENCOUNTER — Encounter (HOSPITAL_BASED_OUTPATIENT_CLINIC_OR_DEPARTMENT_OTHER): Payer: Self-pay | Admitting: *Deleted

## 2023-09-23 ENCOUNTER — Other Ambulatory Visit (HOSPITAL_BASED_OUTPATIENT_CLINIC_OR_DEPARTMENT_OTHER): Payer: Self-pay

## 2023-09-23 VITALS — BP 131/91 | HR 76 | Ht 67.0 in | Wt 219.4 lb

## 2023-09-23 DIAGNOSIS — F4312 Post-traumatic stress disorder, chronic: Secondary | ICD-10-CM | POA: Diagnosis not present

## 2023-09-23 DIAGNOSIS — E66811 Obesity, class 1: Secondary | ICD-10-CM

## 2023-09-23 DIAGNOSIS — F411 Generalized anxiety disorder: Secondary | ICD-10-CM | POA: Diagnosis not present

## 2023-09-23 DIAGNOSIS — R5383 Other fatigue: Secondary | ICD-10-CM | POA: Diagnosis not present

## 2023-09-23 MED ORDER — ONDANSETRON HCL 4 MG PO TABS
4.0000 mg | ORAL_TABLET | Freq: Three times a day (TID) | ORAL | 0 refills | Status: DC | PRN
  Filled 2023-09-23: qty 9, 30d supply, fill #0

## 2023-09-23 MED ORDER — SEMAGLUTIDE-WEIGHT MANAGEMENT 0.25 MG/0.5ML ~~LOC~~ SOAJ
0.2500 mg | SUBCUTANEOUS | 1 refills | Status: DC
  Filled 2023-09-23: qty 2, 28d supply, fill #0

## 2023-09-23 NOTE — Assessment & Plan Note (Signed)
 Continues to follow with the [place].  Also interested in establishing with therapist locally, requesting referral today.  At present, does not feel that symptoms are well-controlled. [facility]  Referral placed to requested provider locally to establish care

## 2023-09-23 NOTE — Assessment & Plan Note (Signed)
 Indicates utilizing Wegovy in the past with good success and without significant side effects.  Would be interested in trying to resume medication.  He has focused on lifestyle modifications in the past, has engaged in these for greater than 3 months without significant improvement noted in regards to BMI.  We will look to restart Wegovy, advised on starting at lowest dose and gradually titrating over time as tolerated

## 2023-09-23 NOTE — Patient Instructions (Signed)
  Medication Instructions:  Your physician recommends that you continue on your current medications as directed. Please refer to the Current Medication list given to you today. --If you need a refill on any your medications before your next appointment, please call your pharmacy first. If no refills are authorized on file call the office.-- Lab Work: Your physician has recommended that you have lab work today: when you are fasting  If you have labs (blood work) drawn today and your tests are completely normal, you will receive your results via MyChart message OR a phone call from our staff.  Please ensure you check your voicemail in the event that you authorized detailed messages to be left on a delegated number. If you have any lab test that is abnormal or we need to change your treatment, we will call you to review the results.    Follow-Up: Your next appointment:   Your physician recommends that you schedule a follow-up appointment in: as needed with Dr. de Peru  You will receive a text message or e-mail with a link to a survey about your care and experience with [place] today! We would greatly appreciate your feedback!   Thanks for letting us be apart of your health journey!!  Primary Care and Sports Medicine   Dr. Ceasar Mons Peru   We encourage you to activate your patient portal called "MyChart".  Sign up information is provided on this After Visit Summary.  MyChart is used to connect with patients for Virtual Visits (Telemedicine).  Patients are able to view lab/test results, encounter notes, upcoming appointments, etc.  Non-urgent messages can be sent to your provider as well. To learn more about what you can do with MyChart, please visit --  [email or web address].

## 2023-09-23 NOTE — Assessment & Plan Note (Signed)
 Continues to have fatigue.  He does note problems symptoms related to PTSD and anxiety/depression.  Also history of sleep apnea, however has not been using CPAP as regularly as he has not been following up with his sleep medicine specialist and thus has not been as attentive to use of CPAP.  He does note that when he was utilizing CPAP, he felt he was getting better sleep.  At last appointment about 6 months ago we completed physical and did place lab orders to be completed, however patient has not had these done as of yet.  He is interested in meeting with therapist locally for additional assistance with management of underlying PTSD. Recommend that he schedule follow-up with sleep medicine specialist to review CPAP use and ways to optimize compliance.  Also recommend that he return to have fasting labs done to complete labs which were ordered previously as part of physical.  We will also place referral to preferred specialist locally for continued assistance with managing PTSD

## 2023-09-23 NOTE — Addendum Note (Signed)
 Addended by: DE Peru, Willean Schurman J on: 09/23/2023 09:48 AM   Modules accepted: Orders

## 2023-09-23 NOTE — Progress Notes (Signed)
    Procedures performed today:    None.  Independent interpretation of notes and tests performed by another provider:   None.  Brief History, Exam, Impression, and Recommendations:    BP (!) 131/91 (BP Location: Left Arm, Patient Position: Sitting, Cuff Size: Normal)   Pulse 76   Ht 5\' 7"  (1.702 m)   Wt 219 lb 6.4 oz (99.5 kg)   SpO2 97%   BMI 34.36 kg/m   Fatigue, unspecified type Assessment & Plan: Continues to have fatigue.  He does note problems symptoms related to PTSD and anxiety/depression.  Also history of sleep apnea, however has not been using CPAP as regularly as he has not been following up with his sleep medicine specialist and thus has not been as attentive to use of CPAP.  He does note that when he was utilizing CPAP, he felt he was getting better sleep.  At last appointment about 6 months ago we completed physical and did place lab orders to be completed, however patient has not had these done as of yet.  He is interested in meeting with therapist locally for additional assistance with management of underlying PTSD. Recommend that he schedule follow-up with sleep medicine specialist to review CPAP use and ways to optimize compliance.  Also recommend that he return to have fasting labs done to complete labs which were ordered previously as part of physical.  We will also place referral to preferred specialist locally for continued assistance with managing PTSD   Obesity, Class I, BMI 30-34.9 Assessment & Plan: Indicates utilizing Wegovy in the past with good success and without significant side effects.  Would be interested in trying to resume medication.  He has focused on lifestyle modifications in the past, has engaged in these for greater than 3 months without significant improvement noted in regards to BMI.  We will look to restart Wegovy, advised on starting at lowest dose and gradually titrating over time as tolerated  Orders: -     Semaglutide-Weight Management;  Inject 0.25 mg into the skin once a week.  Dispense: 2 mL; Refill: 1  Post-traumatic stress disorder, chronic  Generalized anxiety disorder Assessment & Plan: Continues to follow with the [place].  Also interested in establishing with therapist locally, requesting referral today.  At present, does not feel that symptoms are well-controlled. [facility].  Referral placed to requested provider locally to establish care   Other orders -     Ondansetron HCl; Take 1 tablet (4 mg total) by mouth every 8 (eight) hours as needed for nausea or vomiting.  Dispense: 20 tablet; Refill: 0  Return if symptoms worsen or fail to improve, for If able to start [facility], recommend return to the office about 1 month after initiating medication.   ___________________________________________ Derek Antonucci de [place], MD, ABFM, CAQSM Primary Care and Sports Medicine [facility]

## 2023-09-24 ENCOUNTER — Other Ambulatory Visit (HOSPITAL_BASED_OUTPATIENT_CLINIC_OR_DEPARTMENT_OTHER): Payer: Self-pay | Admitting: Family Medicine

## 2023-09-25 ENCOUNTER — Other Ambulatory Visit (HOSPITAL_BASED_OUTPATIENT_CLINIC_OR_DEPARTMENT_OTHER): Payer: Self-pay

## 2023-09-25 LAB — COMPREHENSIVE METABOLIC PANEL
ALT: 36 [IU]/L (ref 0–44)
AST: 25 [IU]/L (ref 0–40)
Albumin: 4.4 g/dL (ref 4.1–5.1)
Alkaline Phosphatase: 85 [IU]/L (ref 44–121)
BUN/Creatinine Ratio: 9 (ref 9–20)
BUN: 8 mg/dL (ref 6–24)
Bilirubin Total: 0.4 mg/dL (ref 0.0–1.2)
CO2: 23 mmol/L (ref 20–29)
Calcium: 9.6 mg/dL (ref 8.7–10.2)
Chloride: 100 mmol/L (ref 96–106)
Creatinine, Ser: 0.91 mg/dL (ref 0.76–1.27)
Globulin, Total: 2.6 g/dL (ref 1.5–4.5)
Glucose: 82 mg/dL (ref 70–99)
Potassium: 4 mmol/L (ref 3.5–5.2)
Sodium: 139 mmol/L (ref 134–144)
Total Protein: 7 g/dL (ref 6.0–8.5)
eGFR: 105 mL/min/{1.73_m2} (ref >59)

## 2023-09-25 LAB — CBC WITH DIFFERENTIAL/PLATELET
Basophils Absolute: 0.1 10*3/uL (ref 0.0–0.2)
Basos: 1 %
EOS (ABSOLUTE): 0.3 10*3/uL (ref 0.0–0.4)
Eos: 4 %
Hematocrit: 49.1 % (ref 37.5–51.0)
Hemoglobin: 16.2 g/dL (ref 13.0–17.7)
Immature Grans (Abs): 0 10*3/uL (ref 0.0–0.1)
Immature Granulocytes: 0 %
Lymphocytes Absolute: 2.6 10*3/uL (ref 0.7–3.1)
Lymphs: 32 %
MCH: 30.6 pg (ref 26.6–33.0)
MCHC: 33 g/dL (ref 31.5–35.7)
MCV: 93 fL (ref 79–97)
Monocytes Absolute: 0.9 10*3/uL (ref 0.1–0.9)
Monocytes: 11 %
Neutrophils Absolute: 4.1 10*3/uL (ref 1.4–7.0)
Neutrophils: 52 %
Platelets: 349 10*3/uL (ref 150–450)
RBC: 5.29 x10E6/uL (ref 4.14–5.80)
RDW: 13.7 % (ref 11.6–15.4)
WBC: 8 10*3/uL (ref 3.4–10.8)

## 2023-09-25 LAB — LIPID PANEL
Chol/HDL Ratio: 6.5 {ratio} — ABNORMAL HIGH (ref 0.0–5.0)
Cholesterol, Total: 268 mg/dL — ABNORMAL HIGH (ref 100–199)
HDL: 41 mg/dL (ref >39)
LDL Chol Calc (NIH): 180 mg/dL — ABNORMAL HIGH (ref 0–99)
Triglycerides: 247 mg/dL — ABNORMAL HIGH (ref 0–149)
VLDL Cholesterol Cal: 47 mg/dL — ABNORMAL HIGH (ref 5–40)

## 2023-09-25 LAB — TSH RFX ON ABNORMAL TO FREE T4: TSH: 1.46 u[IU]/mL (ref 0.450–4.500)

## 2023-09-25 LAB — TESTOSTERONE: Testosterone: 235 ng/dL — ABNORMAL LOW (ref 264–916)

## 2023-09-25 LAB — HEMOGLOBIN A1C
Est. average glucose Bld gHb Est-mCnc: 120 mg/dL
Hgb A1c MFr Bld: 5.8 % — ABNORMAL HIGH (ref 4.8–5.6)

## 2023-10-09 ENCOUNTER — Encounter (HOSPITAL_BASED_OUTPATIENT_CLINIC_OR_DEPARTMENT_OTHER): Payer: Self-pay | Admitting: Family Medicine

## 2023-10-13 ENCOUNTER — Encounter (HOSPITAL_BASED_OUTPATIENT_CLINIC_OR_DEPARTMENT_OTHER): Payer: Self-pay | Admitting: *Deleted

## 2023-10-15 ENCOUNTER — Ambulatory Visit: Admitting: Licensed Clinical Social Worker

## 2023-10-15 DIAGNOSIS — F4310 Post-traumatic stress disorder, unspecified: Secondary | ICD-10-CM

## 2023-10-15 NOTE — Progress Notes (Signed)
 [facility] Counselor/Therapist Progress Note  Patient ID: Derek Erickson, MRN: [id number]    Date: 10/15/23  Time Spent: 0302  pm - 0402 pm : 60 Minutes  Treatment Type: Initial Assessment/Treatment Plan  Reported Symptoms: Loss of interest, poor hygiene, not caring about things, irritability, easily frustrated, poor sleep, low energy, fear of the unknown, easily startled.  Mental Status Exam: Appearance:  Fairly Groomed     Behavior: Appropriate  Motor: Normal  Speech/Language:  Clear and Coherent  Affect: Depressed  Mood: depressed  Thought process: normal  Thought content:   WNL  Sensory/Perceptual disturbances:   WNL  Orientation: oriented to person, place, time/date, situation, day of week, month of year, and year  Attention: Good  Concentration: Fair  Memory: WNL  Fund of knowledge:  Good  Insight:   Fair  Judgment:  Fair  Impulse Control: Fair   Risk Assessment: Danger to Self:  No Self-injurious Behavior: No Danger to Others: No Duty to Warn:no Physical Aggression / Violence:No  Access to Firearms a concern: No  Gang Involvement:No    Subjective:   Derek Erickson participated from office, located at [facility] with Clinician present. Derek Erickson consented to treatment.    Interventions: Cognitive Behavioral Therapy and Grief Therapy  Diagnosis: Post traumatic Stress Disorder  Presenting Problem Chief Complaint: Loss of interest, poor hygiene, not caring about things, irritability, easily frustrated, poor sleep, low energy, fear of the unknown, easily startled.  What are the main stressors in your life right now, how long? Depression  3, Anxiety   3, Mood Swings  3, Appetite Change   3, Work Problems   3, Racing Thoughts   3, Confusion   3, Memory Problems   3, Loss of Interest   3, Irritability   3, Excessive Worrying   3, Low Energy   3, Panic Attacks   3, Obsessive Thoughts   3, Ritualistic Behaviors   3, Checking   3, Counting   3, Change in  Sexual Interest   3, and Poor Concentration   3   Previous mental health services Have you ever been treated for a mental health problem, when, where, by whom? [facility] [place] [facility]  Are you currently seeing a therapist or counselor, counselor's name? No/ NA  Have you ever had a mental health hospitalization, how many times, length of stay? Yes was taken to [place] and the therapist at the [place] decided that patient should be assessed at the [facility] behavioral health. 2 DAYS  Have you ever been treated with medication, name, reason, response? Yes, Patient reports that he was taking 11 medications but now he only takes 3. Patient states that he doesn't feel significant improvement.  Have you ever had suicidal thoughts or attempted suicide, when, how? Yes Approx. 2020, fleeting thoughts./Plan in 2012-Use a shot gun!  Risk factors for Suicide Demographic factors:  Male Current mental status: No plan to harm self or others Loss factors: NA Historical factors: Family history of mental illness or substance abuse Risk Reduction factors: Responsible for children under 21 years of age, Sense of responsibility to family, Employed, Living with another person, especially a relative, and Positive social support Clinical factors:  Severe Anxiety and/or Agitation Depression:   Severe Cognitive features that contribute to risk: NA    SUICIDE RISK:  Minimal: No identifiable suicidal ideation.  Patients presenting with no risk factors but with morbid ruminations; may be classified as minimal risk based on the severity  of the depressive symptoms  Medical history Medical treatment and/or problems, explain: No NA Do you have any issues with chronic pain?  No NA Name of primary care physician/last physical exam: Dr. DaCuba/February 2025  Allergies: Yes Medication, reactions? Prazosin   Current medications: Wellbutrin XL, Celexa, Prilosec, Crestor Prescribed by: [facility]/Katelyn Walker-CONE Is there any  history of mental health problems or substance abuse in your family, whom? Yes father-THC-Alcohol Has anyone in your family been hospitalized, who, where, length of stay? No NA  Social/family history Have you been married, how many times?  2  Do you have children?  2  How many pregnancies have you had?  0  Who lives in your current household? Patient, wife and daughter  Military history: Yes [employer]  Religious/spiritual involvement: NA What religion/faith base are you? Christian  Family of origin (childhood history)  Patient and parents  Where were you born? [place] Where did you grow up? [place] How many different homes have you lived? 5 Describe the atmosphere of the household where you grew up:  "Parents did their best, I was very loved and they tried to shield me from the bad things." Do you have siblings, step/half siblings, list names, relation, sex, age? No   Are your parents separated/divorced, when and why? Yes   Are your parents alive? Yes Father deceased/ Mother is still living.  Social supports (personal and professional): No social support, holds things in.  Education How many grades have you completed? college graduate Did you have any problems in school, what type? Yes outcast, got picked on.  Medications prescribed for these problems? No NA  Employment (financial issues): Employed full time, financial issues.   Legal history: Denied   Trauma/Abuse history:Military Trauma/Law Enforcement Trauma.  Have you ever been exposed to any form of abuse, what type? 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   Substance use Do you use Caffeine? Yes Type, frequency? Daily, multiple drinks of soda.  Do you use Nicotine? Yes Type, frequency, ppd? Vaping daily   Do you use Alcohol? Yes Type, frequency? One drink rarely with dinner. Reports that he drank frequently  previously.  How old were you went you first tasted alcohol? Very young [place] was give a drink by parents friends. Was this accepted by your family? No  When was your last drink, type, how much? Can't remember  Have you ever used illicit drugs or taken more than prescribed, type, frequency, date of last usage? Yes DELTA 8,   Mental Status: General Appearance Derek Erickson:  Casual Eye Contact:  Fair Motor Behavior:  Normal Speech:  Normal Level of Consciousness:  Alert Mood:  Depressed Affect:  Appropriate Anxiety Level:  Moderate Thought Process:  Coherent Thought Content:  WNL Perception:  Normal Judgment:  Fair Insight:  Present Cognition:  Orientation time, place, and person  Diagnosis AXIS I Post Traumatic Stress Disorder  AXIS II No diagnosis  AXIS III @[facility] @  AXIS IV economic problems, occupational problems, other psychosocial or environmental problems, problems related to social environment, and problems with primary support group  AXIS V 51-60 moderate symptoms   Phyllis Ginger MSW, LCSW/DATE 10/15/2023  Individualized Treatment Plan Strengths: "I love my family, and I try to be a good Dad and husband."  Supports: "Not comfortable opening up to people who would be supportive."   Goal/Needs for Treatment:  In order of importance to patient 1) "  I want to improve personal hygiene." 2) " I want to feel better and improve my depressive symptoms."   Client Statement of Needs: "I need help with my depression and hygiene."   Treatment Level: Moderate  Symptoms:Loss of interest, poor hygiene, not caring about things, irritability, easily frustrated, poor sleep, low energy, fear of the unknown, easily startled.  Client Treatment Preferences: Face to Face/Cognitive Behavioral Therapy   Healthcare consumer's goal for treatment:  Counselor, Phyllis Ginger MSW will support the patient's ability to achieve the goals identified. Cognitive Behavioral Therapy, Assertive  Communication/Conflict Resolution Training, Relaxation Training, ACT, Humanistic and other evidenced-based practices will be used to promote progress towards healthy functioning.   Healthcare consumer will: Actively participate in therapy, working towards healthy functioning.    *Justification for Continuation/Discontinuation of Goal: R=Revised, O=Ongoing, A=Achieved, D=Discontinued  Goal 1) I want to improve personal hygiene." Baseline date 10/15/2023: Progress towards goal ongoing; How Often - Daily Target Date Goal Was reviewed Status Code Progress towards goal/Likert rating  10/14/2024  O             1. Establish a Routine: Create a schedule: Develop a daily or weekly schedule that incorporates hygiene tasks like showering, brushing teeth, and changing clothes.  Set reminders: Use phone alarms, sticky notes, or other visual cues to remind yourself to perform hygiene tasks.  Attach hygiene to other activities: Pair showering with the start or end of your workday, or brushing teeth with a specific time.  2. Break Down Tasks: Divide large tasks: If showering feels overwhelming, break it down into smaller steps like getting out of bed, walking to the bathroom, turning on the shower, etc.  Focus on one small step: Start with a single, manageable task each day, like applying deodorant or washing your face.  3. Prioritize Self-Care: Make it enjoyable: Use favorite soaps, shampoos, or bath products to make hygiene feel more like a self-care ritual.  Create a relaxing atmosphere: Light candles, play calming music, or take a warm bath instead of a shower.  Reward yourself: After completing a hygiene task, treat yourself to something enjoyable, like a cup of tea or a relaxing activity.  4. Seek Support: Talk to someone: Share your struggles with a trusted friend, family member, therapist, or doctor.  Ask for help: Don't hesitate to ask for assistance with hygiene tasks if needed.  Join a  support group: Connect with others who are experiencing similar challenges.  5. Practice Self-Compassion: Be kind to yourself: Acknowledge that struggling with hygiene is a common symptom of depression, not a personal failing.  Focus on progress, not perfection: Celebrate small victories and don't get discouraged by setbacks.  Remember that recovery takes time: Be patient with yourself and focus on making gradual improvements.  6. Other Helpful Tips: Use shortcuts: When you're struggling to shower, use dry shampoo, wet wipes, or other quick solutions to freshen up.  Get dressed: Even if you're not feeling up to a full shower, try to put on clean clothes, as it can boost your mood and self-esteem.  Practice mindfulness: Engage in mindfulness or meditation to help manage anxiety and stress, which can worsen depression symptoms.  Get regular exercise: Physical activity can help improve mood and reduce symptoms of depression.  Spend time in nature: Exposure to natural environments can reduce stress and improve mental well-being.  Hygiene Indifference: The Symptom We Don't Talk About  [place] Sep 07, 2021 -- Attach the act of bathing to the start of something, like  the beginning of the work week. This built-in routine can be...  [place] Hygiene Hacks for When You're Depressed Aug 15, 2020 -- It can be extremely embarrassing to have to interact with people if you haven't bathed in a while. A blow to your self...  [facility] Ways Depression Impacts Your Hygiene May 06, 2022 -- How to Take Care of Your Hygiene When Struggling with Depression. Taking care of yourself is essential, especially when...  Goal 2) " I want to feel better and improve my depressive symptoms." Baseline date 10/15/2023 Progress towards goal Ongoing; How Often - Daily Target Date Goal Was reviewed Status Code Progress towards goal  10/14/2024  O             Lifestyle Changes: Regular Exercise: Studies show  that physical activity can improve mood and reduce symptoms of depression. Aim for at least 30 minutes of moderate-intensity exercise most days of the week.  Healthy Diet: Focus on a balanced diet rich in fruits, vegetables, and whole grains. Limit processed foods, sugary drinks, and excessive caffeine or alcohol.  Establish a Routine: Having a regular schedule for meals, sleep, and activities can provide structure and stability, which can be helpful during times of low mood.  Get Adequate Sleep: Aim for 7-9 hours of quality sleep each night. Poor sleep can worsen depression symptoms.  Spend Time in [place]: Studies show that spending time outdoors can reduce stress and improve mood.  Practice Mindfulness and Relaxation Techniques: Techniques like meditation, deep breathing, or yoga can help reduce stress and anxiety, which can contribute to depression.  Social Support and Connection: Stay Connected: Don't isolate yourself. Maintain relationships with family and friends, and seek out opportunities for social interaction.  Seek Support: Talk to a trusted friend, family member, or therapist about how you're feeling. Sharing your experiences can be helpful and validating.  Join a Support Group: Connecting with others who are experiencing similar challenges can provide a sense of community and understanding.  Engage in Meaningful Activities: Find activities that bring you joy and purpose, whether it's volunteering, pursuing hobbies, or spending time with loved ones.  Cognitive and Behavioral Strategies: Challenge Negative Thoughts: Learn to identify and challenge negative or unhelpful thoughts that contribute to your depression.  Set Realistic Goals: Break down large tasks into smaller, more manageable steps to avoid feeling overwhelmed.  Practice Self-Compassion: Treat yourself with the same kindness and understanding that you would offer a friend.  Engage in Problem-Solving: Identify the  stressors in your life and develop strategies to address them.  Avoid Self-Medication: While it might seem like a quick fix, relying on alcohol or drugs can worsen depression symptoms in the long run.    This plan has been reviewed and created by the following participants:  This plan will be reviewed at least every 12 months. Date Behavioral Health Clinician Date Guardian/Patient   10/15/2023 Phyllis Ginger MSW,LCSW  10/15/2023 Verbal Consent Provided

## 2023-10-15 NOTE — Progress Notes (Signed)
 [facility] Counselor/Therapist Progress Note  Patient ID: Derek Erickson, MRN: [id number]    Date: 10/15/23 Individualized Treatment Plan Strengths: "I love my family, and I try to be a good Dad and husband."  Supports: "Not comfortable opening up to people who would be supportive."   Goal/Needs for Treatment:  In order of importance to patient 1) "I want to improve personal hygiene." 2) " I want to feel better and improve my depressive symptoms."   Client Statement of Needs: "I need help with my depression and hygiene."   Treatment Level: Moderate  Symptoms:Loss of interest, poor hygiene, not caring about things, irritability, easily frustrated, poor sleep, low energy, fear of the unknown, easily startled.  Client Treatment Preferences: Face to Face/Cognitive Behavioral Therapy   Healthcare consumer's goal for treatment:  Counselor, Phyllis Ginger MSW will support the patient's ability to achieve the goals identified. Cognitive Behavioral Therapy, Assertive Communication/Conflict Resolution Training, Relaxation Training, ACT, Humanistic and other evidenced-based practices will be used to promote progress towards healthy functioning.   Healthcare consumer will: Actively participate in therapy, working towards healthy functioning.    *Justification for Continuation/Discontinuation of Goal: R=Revised, O=Ongoing, A=Achieved, D=Discontinued  Goal 1) I want to improve personal hygiene." Baseline date 10/15/2023: Progress towards goal ongoing; How Often - Daily Target Date Goal Was reviewed Status Code Progress towards goal/Likert rating  10/14/2024  O             1. Establish a Routine: Create a schedule: Develop a daily or weekly schedule that incorporates hygiene tasks like showering, brushing teeth, and changing clothes.  Set reminders: Use phone alarms, sticky notes, or other visual cues to remind yourself to perform hygiene tasks.  Attach hygiene to other  activities: Pair showering with the start or end of your workday, or brushing teeth with a specific time.  2. Break Down Tasks: Divide large tasks: If showering feels overwhelming, break it down into smaller steps like getting out of bed, walking to the bathroom, turning on the shower, etc.  Focus on one small step: Start with a single, manageable task each day, like applying deodorant or washing your face.  3. Prioritize Self-Care: Make it enjoyable: Use favorite soaps, shampoos, or bath products to make hygiene feel more like a self-care ritual.  Create a relaxing atmosphere: Light candles, play calming music, or take a warm bath instead of a shower.  Reward yourself: After completing a hygiene task, treat yourself to something enjoyable, like a cup of tea or a relaxing activity.  4. Seek Support: Talk to someone: Share your struggles with a trusted friend, family member, therapist, or doctor.  Ask for help: Don't hesitate to ask for assistance with hygiene tasks if needed.  Join a support group: Connect with others who are experiencing similar challenges.  5. Practice Self-Compassion: Be kind to yourself: Acknowledge that struggling with hygiene is a common symptom of depression, not a personal failing.  Focus on progress, not perfection: Celebrate small victories and don't get discouraged by setbacks.  Remember that recovery takes time: Be patient with yourself and focus on making gradual improvements.  6. Other Helpful Tips: Use shortcuts: When you're struggling to shower, use dry shampoo, wet wipes, or other quick solutions to freshen up.  Get dressed: Even if you're not feeling up to a full shower, try to put on clean clothes, as it can boost your mood and self-esteem.  Practice mindfulness: Engage in mindfulness or meditation to help manage anxiety and stress, which can  worsen depression symptoms.  Get regular exercise: Physical activity can help improve mood and reduce symptoms  of depression.  Spend time in nature: Exposure to natural environments can reduce stress and improve mental well-being.  Hygiene Indifference: The Symptom We Don't Talk About  [place] Sep 07, 2021 -- Attach the act of bathing to the start of something, like the beginning of the work week. This built-in routine can be...  [place] Hygiene Hacks for When You're Depressed Aug 15, 2020 -- It can be extremely embarrassing to have to interact with people if you haven't bathed in a while. A blow to your self...  [facility] Ways Depression Impacts Your Hygiene May 06, 2022 -- How to Take Care of Your Hygiene When Struggling with Depression. Taking care of yourself is essential, especially when...  Goal 2) " I want to feel better and improve my depressive symptoms." Baseline date 10/15/2023 Progress towards goal Ongoing; How Often - Daily Target Date Goal Was reviewed Status Code Progress towards goal  10/14/2024  O             Lifestyle Changes: Regular Exercise: Studies show that physical activity can improve mood and reduce symptoms of depression. Aim for at least 30 minutes of moderate-intensity exercise most days of the week.  Healthy Diet: Focus on a balanced diet rich in fruits, vegetables, and whole grains. Limit processed foods, sugary drinks, and excessive caffeine or alcohol.  Establish a Routine: Having a regular schedule for meals, sleep, and activities can provide structure and stability, which can be helpful during times of low mood.  Get Adequate Sleep: Aim for 7-9 hours of quality sleep each night. Poor sleep can worsen depression symptoms.  Spend Time in [place]: Studies show that spending time outdoors can reduce stress and improve mood.  Practice Mindfulness and Relaxation Techniques: Techniques like meditation, deep breathing, or yoga can help reduce stress and anxiety, which can contribute to depression.  Social Support and Connection: Stay Connected: Don't  isolate yourself. Maintain relationships with family and friends, and seek out opportunities for social interaction.  Seek Support: Talk to a trusted friend, family member, or therapist about how you're feeling. Sharing your experiences can be helpful and validating.  Join a Support Group: Connecting with others who are experiencing similar challenges can provide a sense of community and understanding.  Engage in Meaningful Activities: Find activities that bring you joy and purpose, whether it's volunteering, pursuing hobbies, or spending time with loved ones.  Cognitive and Behavioral Strategies: Challenge Negative Thoughts: Learn to identify and challenge negative or unhelpful thoughts that contribute to your depression.  Set Realistic Goals: Break down large tasks into smaller, more manageable steps to avoid feeling overwhelmed.  Practice Self-Compassion: Treat yourself with the same kindness and understanding that you would offer a friend.  Engage in Problem-Solving: Identify the stressors in your life and develop strategies to address them.  Avoid Self-Medication: While it might seem like a quick fix, relying on alcohol or drugs can worsen depression symptoms in the long run.    This plan has been reviewed and created by the following participants:  This plan will be reviewed at least every 12 months. Date Behavioral Health Clinician Date Guardian/Patient   10/15/2023 Phyllis Ginger MSW,LCSW  10/15/2023 Verbal Consent Provided                     Treatment Type: Treatment PLAN

## 2023-10-27 ENCOUNTER — Ambulatory Visit (INDEPENDENT_AMBULATORY_CARE_PROVIDER_SITE_OTHER): Admitting: Licensed Clinical Social Worker

## 2023-10-27 DIAGNOSIS — F4310 Post-traumatic stress disorder, unspecified: Secondary | ICD-10-CM

## 2023-10-27 NOTE — Progress Notes (Signed)
 [facility] Counselor/Therapist Progress Note  Patient ID: Derek Erickson, MRN: [id number]    Date: 10/27/23  Time Spent: 0200  pm - 0305 pm : 65 Minutes  Treatment Type: Individual Therapy.  Reported Symptoms: Loss of interest, poor hygiene, not caring about things, irritability, easily frustrated, poor sleep, low energy, fear of the unknown, easily startled.   Mental Status Exam: Appearance:  Fairly Groomed     Behavior: Appropriate  Motor: Normal  Speech/Language:  Clear and Coherent  Affect: Depressed  Mood: depressed  Thought process: normal  Thought content:   WNL  Sensory/Perceptual disturbances:   WNL  Orientation: oriented to person, place, time/date, situation, day of week, month of year, and year  Attention: Good  Concentration: Fair  Memory: WNL  Fund of knowledge:  Good  Insight:   Fair  Judgment:  Fair  Impulse Control: Fair    Risk Assessment: Danger to Self:  No Self-injurious Behavior: No Danger to Others: No Duty to Warn:no Physical Aggression / Violence:No  Access to Firearms a concern: No  Gang Involvement:No    DIAGNOSIS: PTSD Subjective:    Derek Erickson participated from office, located at [facility] with Clinician present. Derek Erickson consented to treatment.    Derek Erickson presented for his session in a positive mood. Derek Erickson shared that he had issues at work due to his hygiene. Patient shared that he was angry at first but later recognized that he could smell an odor. Patient reports that he understands but isn't sure why but he often doesn't even think about taking a shower.   Clinician provided feedback and support via active listening and verbal interaction. Clinician processed with patient when this began to be problem. Clinician encouraged patient to set a routine and for him to establish this routine based on his family and their situation. Clinician pointed out that having a routine can help [place] to do the things we need to do without having  to think about it.   Patient reports that his poor hygiene began while in [place] in a place where they had no water or natural resources. Patient reports that he had to go weeks without water to wash with because the water had to be used for drinking. He states that he can remember that was when he thinks it initially became an issue. Patient admits that he does lack structure in his own life and that his family has no routine. Patient verbalized that he can see the importance of a routine and how this would be helpful in completing daily task. Patient will continue to complete weekly therapy. Treatment plan will be reviewed by 10/14/2024. Patient will work on the following:Establish a Routine: Create a schedule: Develop a daily or weekly schedule that incorporates hygiene tasks like showering, brushing teeth, and changing clothes.  Set reminders: Use phone alarms, sticky notes, or other visual cues to remind yourself to perform hygiene tasks.  Attach hygiene to other activities: Pair showering with the start or end of your workday, or brushing teeth with a specific time.  2. Break Down Tasks: Divide large tasks: If showering feels overwhelming, break it down into smaller steps like getting out of bed, walking to the bathroom, turning on the shower, etc.  Focus on one small step: Start with a single, manageable task each day, like applying deodorant or washing your face.  3. Prioritize Self-Care: Make it enjoyable: Use favorite soaps, shampoos, or bath products to make hygiene feel more like a self-care ritual.  Create  a relaxing atmosphere: Light candles, play calming music, or take a warm bath instead of a shower.  Reward yourself: After completing a hygiene task, treat yourself to something enjoyable, like a cup of tea or a relaxing activity.  4. Seek Support: Talk to someone: Share your struggles with a trusted friend, family member, therapist, or doctor.  Ask for help: Don't hesitate to ask  for assistance with hygiene tasks if needed.  Join a support group: Connect with others who are experiencing similar challenges.  5. Practice Self-Compassion: Be kind to yourself: Acknowledge that struggling with hygiene is a common symptom of depression, not a personal failing.  Focus on progress, not perfection: Celebrate small victories and don't get discouraged by setbacks.  Remember that recovery takes time: Be patient with yourself and focus on making gradual improvements.  6. Other Helpful Tips: Use shortcuts: When you're struggling to shower, use dry shampoo, wet wipes, or other quick solutions to freshen up.  Get dressed: Even if you're not feeling up to a full shower, try to put on clean clothes, as it can boost your mood and self-esteem.  Practice mindfulness: Engage in mindfulness or meditation to help manage anxiety and stress, which can worsen depression symptoms.  Get regular exercise: Physical activity can help improve mood and reduce symptoms of depression.  Spend time in nature: Exposure to natural environments can reduce stress and improve mental well-being.     Phyllis Ginger MSW, LCSW/DATE 10/27/2023

## 2023-11-05 ENCOUNTER — Ambulatory Visit: Payer: Self-pay | Admitting: Licensed Clinical Social Worker

## 2023-11-11 ENCOUNTER — Other Ambulatory Visit (HOSPITAL_BASED_OUTPATIENT_CLINIC_OR_DEPARTMENT_OTHER): Payer: Self-pay

## 2023-11-11 ENCOUNTER — Other Ambulatory Visit (HOSPITAL_BASED_OUTPATIENT_CLINIC_OR_DEPARTMENT_OTHER): Payer: Self-pay | Admitting: Family Medicine

## 2023-11-11 ENCOUNTER — Other Ambulatory Visit: Payer: Self-pay

## 2023-11-11 DIAGNOSIS — K219 Gastro-esophageal reflux disease without esophagitis: Secondary | ICD-10-CM

## 2023-11-11 MED ORDER — OMEPRAZOLE 40 MG PO CPDR
40.0000 mg | DELAYED_RELEASE_CAPSULE | Freq: Every day | ORAL | 3 refills | Status: AC
  Filled 2023-11-11: qty 30, 30d supply, fill #0

## 2023-11-14 ENCOUNTER — Other Ambulatory Visit (HOSPITAL_BASED_OUTPATIENT_CLINIC_OR_DEPARTMENT_OTHER): Payer: Self-pay

## 2023-11-19 ENCOUNTER — Ambulatory Visit (INDEPENDENT_AMBULATORY_CARE_PROVIDER_SITE_OTHER): Admitting: Licensed Clinical Social Worker

## 2023-11-19 ENCOUNTER — Encounter (HOSPITAL_BASED_OUTPATIENT_CLINIC_OR_DEPARTMENT_OTHER): Payer: Self-pay | Admitting: Family Medicine

## 2023-11-19 DIAGNOSIS — F4310 Post-traumatic stress disorder, unspecified: Secondary | ICD-10-CM

## 2023-11-19 NOTE — Telephone Encounter (Signed)
 Dr. De Peru, Please see mychart message sent by pt and advise.

## 2023-11-20 NOTE — Progress Notes (Signed)
 [facility] Counselor/Therapist Progress Note  Derek Erickson ID: Derek Erickson, MRN: [id number]    Date: 11/19/2023  Time Spent: 0301  pm - 0330 pm : 29 Minutes  Treatment Type: Individual Therapy.  Reported Symptoms: Loss of interest, poor hygiene, not caring about things, irritability, easily frustrated, poor sleep, low energy, fear of the unknown, easily startled.   Mental Status Exam: Appearance:  Fairly Groomed     Behavior: Appropriate  Motor: Normal  Speech/Language:  Clear and Coherent  Affect: Depressed  Mood: depressed  Thought process: normal  Thought content:   WNL  Sensory/Perceptual disturbances:   WNL  Orientation: oriented to person, place, time/date, situation, day of week, month of year, and year  Attention: Good  Concentration: Fair  Memory: WNL  Fund of knowledge:  Good  Insight:   Fair  Judgment:  Fair  Impulse Control: Fair    Risk Assessment: Danger to Self:  No Self-injurious Behavior: No Danger to Others: No Duty to Warn:no Physical Aggression / Violence:No  Access to Firearms a concern: No  Gang Involvement:No    DIAGNOSIS: PTSD  Subjective:    Derek Erickson participated from office, located at [facility] with Clinician present. Derek Erickson consented to treatment.    Derek Erickson presented for his session expressing his feelings of being overwhelmed and defeated. Derek Erickson reports that he can no longer afford to come to counseling due to his financial situation. He reports that his wife has had some health issues that has left her out of work and unable to pay for their second car that is due to be repossessed today. Derek Erickson was tearful as she shared his feelings of not being what he needs to be. Derek Erickson expressed his desire to get ahead financially and to gain motivation to do the things necessary to be productive. Derek Erickson was depressed and defeated as evidenced through his feedback and  affect.   Clinician actively listened and provided Derek Erickson with  verbal support and resources through the [facility] for free counseling. Clinician referred Derek Erickson to the [facility] for both group and individual therapy that is free to all Veterans. Clinician also connected Derek Erickson to a Metallurgist via number obtained by the [facility] for Derek Erickson to speak with about increasing his disability compensation. Clinician encouraged Derek Erickson to stay focussed on his end goal but to do at least one thing daily that will assist him in reaching that goal.  Derek Erickson was actively involved in session and transparent about his situation and emotions. Derek Erickson agreed to follow up with the [facility] and the [facility]. Derek Erickson would like to keep his file open here so if he needs to return he has the option to do so. Treatment plan will be reviewed by  10/14/2024.  Interventions: Cognitive Behavioral Therapy and Motivational Interviewing  Diagnosis: PTSD   Keenan Pastor MSW, LCSW/DATE 11/19/2023

## 2023-11-21 ENCOUNTER — Encounter (HOSPITAL_BASED_OUTPATIENT_CLINIC_OR_DEPARTMENT_OTHER): Payer: Self-pay | Admitting: Family Medicine

## 2023-11-21 ENCOUNTER — Other Ambulatory Visit (HOSPITAL_BASED_OUTPATIENT_CLINIC_OR_DEPARTMENT_OTHER): Payer: Self-pay

## 2023-11-21 ENCOUNTER — Ambulatory Visit (INDEPENDENT_AMBULATORY_CARE_PROVIDER_SITE_OTHER): Payer: Self-pay | Admitting: Family Medicine

## 2023-11-21 VITALS — BP 141/96 | HR 85 | Ht 67.0 in | Wt 221.4 lb

## 2023-11-21 DIAGNOSIS — G4700 Insomnia, unspecified: Secondary | ICD-10-CM

## 2023-11-21 DIAGNOSIS — F4310 Post-traumatic stress disorder, unspecified: Secondary | ICD-10-CM

## 2023-11-21 DIAGNOSIS — G4733 Obstructive sleep apnea (adult) (pediatric): Secondary | ICD-10-CM | POA: Insufficient documentation

## 2023-11-21 MED ORDER — QUETIAPINE FUMARATE 25 MG PO TABS
25.0000 mg | ORAL_TABLET | Freq: Every day | ORAL | 3 refills | Status: AC
  Filled 2023-11-21: qty 30, 30d supply, fill #0

## 2023-11-21 NOTE — Progress Notes (Signed)
 Subjective:   Derek Erickson 10/28/1975 11/21/2023  Chief Complaint  Patient presents with   Insomnia    Pt states he began to have worsening problems with his sleep about 1 week ago. States he has problems trying to fall asleep and also has problems staying asleep. States he has even started dozing off while driving.    HPI: Derek Erickson presents today for re-assessment and management of chronic medical conditions.  INSOMNIA: Derek Erickson presents for the medical management of Insomnia. Patient reports PTSD ongoing since 2003 during [employer] and on nightshift. He states he has difficulty with falling asleep and awakening frequently during the night. He has been having significant difficulty staying awake, even during driving. He has noticed he has to have windows down, passenger, or music going. He states with sitting down, he gets drowsy.   He states he will fall asleep at night and then wake up every 45 minutes. He is up for a few minutes and then back to sleep. He reports nightmares due occur at night that impair sleep as well. He has been seeing Counselor for help with PTSD.   He does have sleep apnea and has CPAP and uses face mask.  He does not use frequently as he feels it "is suffocating me".   Average hours of sleep: 2-3 hours  Medications Tried: Melatonin, Valerian Root, Trazodone    The following portions of the patient's history were reviewed and updated as appropriate: past medical history, past surgical history, family history, social history, allergies, medications, and problem list.   Patient Active Problem List   Diagnosis Date Noted   Obstructive sleep apnea 11/21/2023   Wellness examination 03/24/2023   Obesity, Class I, BMI 30-34.9 08/05/2022   Encounter for annual physical exam 03/27/2022   Gastroesophageal reflux disease 03/21/2022   Fatigue 12/19/2021   Testosterone  deficiency in male 12/19/2021   Migraine without aura and with status  migrainosus, not intractable 12/19/2021   Posttraumatic stress disorder 07/10/2021   Eczema 07/10/2021   Dyslipidemia 01/04/2021   Hypertriglyceridemia 01/04/2021   Transaminitis 01/04/2021   Bipolar II disorder (HCC) 11/28/2020   Insomnia 11/28/2020   Generalized anxiety disorder 02/27/2016   Recurrent major depressive disorder, in remission (HCC) 02/27/2016   Past Medical History:  Diagnosis Date   Anxiety    Daytime somnolence 11/28/2020   Depression    Erythrocytosis 01/04/2021   Fatigue 12/19/2021   Folate deficiency 12/19/2021   Leukocytosis 01/04/2021   Other microscopic hematuria 09/14/2021   Rash of finger 07/10/2021   Past Surgical History:  Procedure Laterality Date   TONSILLECTOMY     Family History  Problem Relation Age of Onset   Diabetes Mother    Hyperlipidemia Father    Hypertension Father    Stroke Father    Heart attack Father    Heart disease Father    Dementia Maternal Grandmother    Cancer Maternal Grandfather    Outpatient Medications Prior to Visit  Medication Sig Dispense Refill   omeprazole  (PRILOSEC) 40 MG capsule Take 1 capsule (40 mg total) by mouth daily. 30 capsule 3   rosuvastatin  (CRESTOR ) 20 MG tablet Take 1 tablet (20 mg total) by mouth daily. 90 tablet 3   aspirin  EC 81 MG tablet Take 1 tablet (81 mg total) by mouth daily. Swallow whole. (Patient not taking: Reported on 10/15/2023) 90 tablet 3   buPROPion (WELLBUTRIN XL) 300 MG 24 hr tablet Take 300 mg by mouth daily. (Patient not taking:  Reported on 11/21/2023)     citalopram  (CELEXA ) 40 MG tablet Take 1 tablet (40 mg total) by mouth daily. (Patient not taking: Reported on 11/21/2023) 90 tablet 3   ondansetron  (ZOFRAN ) 4 MG tablet Take 1 tablet (4 mg total) by mouth every 8 (eight) hours as needed for nausea or vomiting. 20 tablet 0   Semaglutide -Weight Management 0.25 MG/0.5ML SOAJ Inject 0.25 mg into the skin once a week. 2 mL 1   No facility-administered medications prior to visit.    Allergies  Allergen Reactions   Prazosin Other (See Comments)    LOC and Dizziness      ROS: A complete ROS was performed with pertinent positives/negatives noted in the HPI. The remainder of the ROS are negative.    Objective:   Today's Vitals   11/21/23 0928  BP: (!) 141/96  Pulse: 85  SpO2: 96%  Weight: 221 lb 6.4 oz (100.4 kg)  Height: 5\' 7"  (1.702 m)    Physical Exam          GENERAL: Well-appearing, in NAD. Well nourished.  SKIN: Pink, warm and dry.   Head: Normocephalic. NECK: Trachea midline. Full ROM w/o pain or tenderness.  RESPIRATORY: Chest wall symmetrical. Respirations even and non-labored. Breath sounds clear to auscultation bilaterally.  CARDIAC: S1, S2 present, regular rate and rhythm without murmur or gallops. Peripheral pulses 2+ bilaterally.  MSK: Muscle tone and strength appropriate for age.  NEUROLOGIC: No motor or sensory deficits. Steady, even gait. C2-C12 intact.  PSYCH/MENTAL STATUS: Alert, oriented x 3. Cooperative, appropriate mood and affect.      Assessment & Plan:  1. Insomnia, unspecified type (Primary) 2. Posttraumatic stress disorder Discussed insomnia is multifactorial with uncontrolled OSA and psychologic factors such as PTSD, Bipolar diagnosis and MDD. Recommend starting Seroquel  25mg  at bedtime and trial dosage for at least 1 week with titration as needed. Recommend psychiatry follow up per Dr. De Peru as well. Safe use and possible SE reviewed with patient.   - QUEtiapine  (SEROQUEL ) 25 MG tablet; Take 1 tablet (25 mg total) by mouth at bedtime.  Dispense: 30 tablet; Refill: 3   3. Obstructive sleep apnea Pt will obtain new mask/nasal appliance for better compliance. Discussed patho of OSA and effects with patient and emphasized need for adherence.    Meds ordered this encounter  Medications   QUEtiapine  (SEROQUEL ) 25 MG tablet    Sig: Take 1 tablet (25 mg total) by mouth at bedtime.    Dispense:  30 tablet    Refill:  3     Supervising Provider:   DE Peru, RAYMOND J [[id number]]   Return in about 4 weeks (around 12/19/2023) for Follow up Insomnia .    Patient to reach out to office if new, worrisome, or unresolved symptoms arise or if no improvement in patient's condition. Patient verbalized understanding and is agreeable to treatment plan. All questions answered to patient's satisfaction.    Nonda Bays, [place]

## 2024-01-06 ENCOUNTER — Encounter (HOSPITAL_BASED_OUTPATIENT_CLINIC_OR_DEPARTMENT_OTHER): Payer: Self-pay | Admitting: Family Medicine

## 2024-01-06 ENCOUNTER — Ambulatory Visit (HOSPITAL_BASED_OUTPATIENT_CLINIC_OR_DEPARTMENT_OTHER): Payer: Self-pay | Admitting: Family Medicine

## 2024-01-06 VITALS — BP 138/89 | HR 79 | Ht 67.0 in | Wt 229.0 lb

## 2024-01-06 DIAGNOSIS — E6609 Other obesity due to excess calories: Secondary | ICD-10-CM

## 2024-01-06 DIAGNOSIS — E66812 Obesity, class 2: Secondary | ICD-10-CM

## 2024-01-06 DIAGNOSIS — F4310 Post-traumatic stress disorder, unspecified: Secondary | ICD-10-CM

## 2024-01-06 DIAGNOSIS — G4733 Obstructive sleep apnea (adult) (pediatric): Secondary | ICD-10-CM

## 2024-01-06 DIAGNOSIS — G4700 Insomnia, unspecified: Secondary | ICD-10-CM

## 2024-01-06 NOTE — Progress Notes (Signed)
 Subjective:   Derek Erickson 1975/11/22 01/06/2024  Chief Complaint  Patient presents with   Insomnia    Pt states the medication has helped him become sleepy so he can go to sleep quicker but states that he is still waking up in the middle of the night. States even though waking up in the middle of the night, it has gotten better.    HPI: Derek Erickson presents today for re-assessment and management of chronic medical conditions.  INSOMNIA: Derek Erickson presents for the medical management of Insomnia. He is using Seroquel  as needed and reports becoming sleepy on his own. He states he sleeping mostly throughout the night and only up 1-2 x per night. He sleeps for 7 hours approx. He did get a CPAP with face mask through his Cardiologist and is using a nasal appliance. He has ordered a new nasal mask to tolerate better. Reports PTSD has improved with seroquel  as well and is improved.   WEIGHT LOSS:  Patient states he is enrolling in a research program using GLP 1 medications and is inquiring about information regarding tirzepatide.    The following portions of the patient's history were reviewed and updated as appropriate: past medical history, past surgical history, family history, social history, allergies, medications, and problem list.   Patient Active Problem List   Diagnosis Date Noted   Obstructive sleep apnea 11/21/2023   Wellness examination 03/24/2023   Obesity, Class I, BMI 30-34.9 08/05/2022   Encounter for annual physical exam 03/27/2022   Gastroesophageal reflux disease 03/21/2022   Fatigue 12/19/2021   Testosterone  deficiency in male 12/19/2021   Migraine without aura and with status migrainosus, not intractable 12/19/2021   Posttraumatic stress disorder 07/10/2021   Eczema 07/10/2021   Dyslipidemia 01/04/2021   Hypertriglyceridemia 01/04/2021   Transaminitis 01/04/2021   Bipolar II disorder (HCC) 11/28/2020   Insomnia 11/28/2020   Generalized anxiety disorder  02/27/2016   Recurrent major depressive disorder, in remission (HCC) 02/27/2016   Past Medical History:  Diagnosis Date   Anxiety    Daytime somnolence 11/28/2020   Depression    Erythrocytosis 01/04/2021   Fatigue 12/19/2021   Folate deficiency 12/19/2021   Leukocytosis 01/04/2021   Other microscopic hematuria 09/14/2021   Rash of finger 07/10/2021   Past Surgical History:  Procedure Laterality Date   TONSILLECTOMY     Family History  Problem Relation Age of Onset   Diabetes Mother    Hyperlipidemia Father    Hypertension Father    Stroke Father    Heart attack Father    Heart disease Father    Dementia Maternal Grandmother    Cancer Maternal Grandfather    Outpatient Medications Prior to Visit  Medication Sig Dispense Refill   omeprazole  (PRILOSEC) 40 MG capsule Take 1 capsule (40 mg total) by mouth daily. 30 capsule 3   QUEtiapine  (SEROQUEL ) 25 MG tablet Take 1 tablet (25 mg total) by mouth at bedtime. 30 tablet 3   rosuvastatin  (CRESTOR ) 20 MG tablet Take 1 tablet (20 mg total) by mouth daily. 90 tablet 3   No facility-administered medications prior to visit.   Allergies  Allergen Reactions   Prazosin Other (See Comments)    LOC and Dizziness      ROS: A complete ROS was performed with pertinent positives/negatives noted in the HPI. The remainder of the ROS are negative.    Objective:   Today's Vitals   01/06/24 1347  BP: 138/89  Pulse: 79  SpO2: 97%  Weight: 229 lb (103.9 kg)  Height: 5\' 7"  (1.702 m)    Physical Exam          GENERAL: Well-appearing, in NAD. Well nourished.  SKIN: Pink, warm and dry.  Head: Normocephalic. NECK: Trachea midline. Full ROM w/o pain or tenderness.  RESPIRATORY: Chest wall symmetrical. Respirations even and non-labored.  EXTREMITIES: Without clubbing, cyanosis, or edema.  NEUROLOGIC: No motor or sensory deficits. Steady, even gait. C2-C12 intact.  PSYCH/MENTAL STATUS: Alert, oriented x 3. Cooperative, appropriate mood and  affect.      Assessment & Plan:  1. Insomnia, unspecified type (Primary) 2. Posttraumatic stress disorder Improved. Continue Seroquel  25mg  as directed. Reach out to PCP if worsening or needing to titrate medication. Safe use of medication reviewed with patient.   3. Obstructive sleep apnea . Will obtain new nasal appliance to improve tolerability. Will reach out to Cardiology if requiring assistance with DME supplies.   4. Class 2 obesity due to excess calories in adult, unspecified BMI, unspecified whether serious comorbidity present Discussed risks and use of GLP 1 medications with needed diet and lifestyle recommendations. Pt verbalized understanding of risk and possible side effects with study.   Return for As scheduled .    Patient to reach out to office if new, worrisome, or unresolved symptoms arise or if no improvement in patient's condition. Patient verbalized understanding and is agreeable to treatment plan. All questions answered to patient's satisfaction.    Nonda Bays, [place]

## 2024-03-24 ENCOUNTER — Encounter (HOSPITAL_BASED_OUTPATIENT_CLINIC_OR_DEPARTMENT_OTHER): Payer: Managed Care, Other (non HMO) | Admitting: Family Medicine

## 2024-04-08 ENCOUNTER — Other Ambulatory Visit (HOSPITAL_BASED_OUTPATIENT_CLINIC_OR_DEPARTMENT_OTHER): Payer: Self-pay

## 2024-04-08 MED ORDER — FLUZONE 0.5 ML IM SUSY
0.5000 mL | PREFILLED_SYRINGE | Freq: Once | INTRAMUSCULAR | 0 refills | Status: AC
  Filled 2024-04-08: qty 0.5, 1d supply, fill #0
